# Patient Record
Sex: Female | Born: 1980
Health system: Southern US, Community
[De-identification: ages and names within clinical notes are randomized; demographics above are authoritative.]

## PROBLEM LIST (undated history)

## (undated) DIAGNOSIS — E669 Obesity, unspecified: Secondary | ICD-10-CM

## (undated) DIAGNOSIS — I10 Essential (primary) hypertension: Secondary | ICD-10-CM

## (undated) DIAGNOSIS — F419 Anxiety disorder, unspecified: Secondary | ICD-10-CM

## (undated) HISTORY — DX: Anxiety disorder, unspecified: F41.9

## (undated) HISTORY — DX: Obesity, unspecified: E66.9

---

## 2001-02-23 ENCOUNTER — Other Ambulatory Visit: Admission: RE | Admit: 2001-02-23 | Discharge: 2001-02-23 | Payer: Self-pay | Admitting: Family Medicine

## 2014-03-12 DIAGNOSIS — I10 Essential (primary) hypertension: Secondary | ICD-10-CM | POA: Insufficient documentation

## 2014-06-15 DIAGNOSIS — E669 Obesity, unspecified: Secondary | ICD-10-CM | POA: Insufficient documentation

## 2015-01-04 ENCOUNTER — Encounter: Payer: Self-pay | Admitting: *Deleted

## 2015-01-08 ENCOUNTER — Ambulatory Visit (INDEPENDENT_AMBULATORY_CARE_PROVIDER_SITE_OTHER): Payer: BLUE CROSS/BLUE SHIELD | Admitting: *Deleted

## 2015-01-08 VITALS — BP 123/88 | HR 78 | Ht 65.0 in | Wt 287.7 lb

## 2015-01-08 DIAGNOSIS — E669 Obesity, unspecified: Secondary | ICD-10-CM

## 2015-01-08 MED ORDER — CYANOCOBALAMIN 1000 MCG/ML IJ SOLN
1000.0000 ug | Freq: Once | INTRAMUSCULAR | Status: AC
  Administered 2015-01-08: 1000 ug via INTRAMUSCULAR

## 2015-01-08 NOTE — Progress Notes (Cosign Needed)
Pt is here for wt, bp check, b-12 inj Denies any s/e, she is doing well and proud of her weight loss

## 2015-02-05 ENCOUNTER — Ambulatory Visit (INDEPENDENT_AMBULATORY_CARE_PROVIDER_SITE_OTHER): Payer: BLUE CROSS/BLUE SHIELD | Admitting: Obstetrics and Gynecology

## 2015-02-05 VITALS — BP 128/84 | HR 88 | Ht 65.0 in | Wt 283.5 lb

## 2015-02-05 DIAGNOSIS — E669 Obesity, unspecified: Secondary | ICD-10-CM

## 2015-02-05 MED ORDER — CYANOCOBALAMIN 1000 MCG/ML IJ SOLN
1000.0000 ug | Freq: Once | INTRAMUSCULAR | Status: AC
  Administered 2015-02-05: 1000 ug via INTRAMUSCULAR

## 2015-02-05 NOTE — Progress Notes (Cosign Needed)
Pt is here for wt, bp check, b-12 inj Pt is doing well denies any s/e with medication

## 2015-03-06 ENCOUNTER — Ambulatory Visit (INDEPENDENT_AMBULATORY_CARE_PROVIDER_SITE_OTHER): Payer: BLUE CROSS/BLUE SHIELD | Admitting: Obstetrics and Gynecology

## 2015-03-06 VITALS — BP 132/85 | HR 75 | Ht 65.0 in | Wt 280.2 lb

## 2015-03-06 DIAGNOSIS — E669 Obesity, unspecified: Secondary | ICD-10-CM

## 2015-03-06 MED ORDER — CYANOCOBALAMIN 1000 MCG/ML IJ SOLN
1000.0000 ug | Freq: Once | INTRAMUSCULAR | Status: AC
  Administered 2015-03-06: 1000 ug via INTRAMUSCULAR

## 2015-03-06 NOTE — Progress Notes (Signed)
Patient ID: Cathy Jimenez, female   DOB: 1980-08-11, 34 y.o.   MRN: [id number] Pt presents for weight, B/P check and B-12 injection. Has weight loss of 3.4 lbs since last visit. No c/o side effects of medication. Doing well.

## 2015-03-20 ENCOUNTER — Ambulatory Visit: Payer: BLUE CROSS/BLUE SHIELD

## 2015-03-20 ENCOUNTER — Encounter: Payer: Self-pay | Admitting: Obstetrics and Gynecology

## 2015-03-20 MED ORDER — PHENTERMINE HCL 37.5 MG PO TABS: 37.5000 mg | ORAL_TABLET | Freq: Every day | ORAL | Status: DC

## 2015-03-29 ENCOUNTER — Ambulatory Visit: Payer: BLUE CROSS/BLUE SHIELD | Admitting: Obstetrics and Gynecology

## 2015-04-26 ENCOUNTER — Ambulatory Visit (INDEPENDENT_AMBULATORY_CARE_PROVIDER_SITE_OTHER): Payer: BLUE CROSS/BLUE SHIELD | Admitting: Obstetrics and Gynecology

## 2015-04-26 VITALS — BP 122/84 | HR 73 | Ht 64.0 in | Wt 278.2 lb

## 2015-04-26 DIAGNOSIS — E669 Obesity, unspecified: Secondary | ICD-10-CM

## 2015-04-26 MED ORDER — CYANOCOBALAMIN 1000 MCG/ML IJ SOLN
1000.0000 ug | Freq: Once | INTRAMUSCULAR | Status: AC
  Administered 2015-04-26: 1000 ug via INTRAMUSCULAR
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
## 2015-05-24 ENCOUNTER — Ambulatory Visit: Payer: BLUE CROSS/BLUE SHIELD

## 2015-07-19 ENCOUNTER — Ambulatory Visit: Payer: BLUE CROSS/BLUE SHIELD

## 2015-07-25 ENCOUNTER — Ambulatory Visit (INDEPENDENT_AMBULATORY_CARE_PROVIDER_SITE_OTHER): Payer: BLUE CROSS/BLUE SHIELD | Admitting: Obstetrics and Gynecology

## 2015-07-25 VITALS — BP 108/78 | HR 62 | Wt 273.4 lb

## 2015-07-25 DIAGNOSIS — E669 Obesity, unspecified: Secondary | ICD-10-CM

## 2015-07-25 MED ORDER — CYANOCOBALAMIN 1000 MCG/ML IJ SOLN
1000.0000 ug | Freq: Once | INTRAMUSCULAR | Status: AC
  Administered 2015-07-25: 1000 ug via INTRAMUSCULAR

## 2015-07-25 NOTE — Progress Notes (Cosign Needed)
Patient ID: Cathy Jimenez, female   DOB: 16-May-1981, 35 y.o.   MRN: [id number] Pt presents for weight, B/P, B-12 injection. No side effects of medication-Phentermine, or B-12.  Weight loss of 5 lbs. Encouraged eating healthy and exercise.

## 2015-08-22 ENCOUNTER — Ambulatory Visit: Payer: BLUE CROSS/BLUE SHIELD | Admitting: Obstetrics and Gynecology

## 2015-08-27 DIAGNOSIS — Z7189 Other specified counseling: Secondary | ICD-10-CM | POA: Insufficient documentation

## 2015-08-28 ENCOUNTER — Encounter: Payer: Self-pay | Admitting: Obstetrics and Gynecology

## 2015-08-28 ENCOUNTER — Ambulatory Visit (INDEPENDENT_AMBULATORY_CARE_PROVIDER_SITE_OTHER): Payer: BLUE CROSS/BLUE SHIELD | Admitting: Obstetrics and Gynecology

## 2015-08-28 MED ORDER — CYANOCOBALAMIN 1000 MCG/ML IJ SOLN: 1000.0000 ug | Freq: Once | INTRAMUSCULAR | Status: DC

## 2015-08-28 MED ORDER — PHENTERMINE HCL 37.5 MG PO TABS: 37.5000 mg | ORAL_TABLET | Freq: Every day | ORAL | Status: DC

## 2015-08-28 NOTE — Progress Notes (Signed)
SUBJECTIVE:  34 y.o. here for follow-up weight loss visit, previously seen 4 weeks ago. Denies any concerns and feels like medication worked well when she takes it regularly and exercises as she should. Did regain 5 lbs since last visit.   OBJECTIVE:  BP 127/83 mmHg  Pulse 62  Ht  (1.626 m)  Wt 280 lb 6.4 oz (127.189 kg)  BMI 48.11 kg/m2  Body mass index is 48.11 kg/(m^2). Patient appears well. ASSESSMENT:  Obesity- responding well to weight loss plan PLAN:  To continue with current medications and new RX.. B12 1055mcg/ml injection given RTC in 4 weeks as planned. Explained if patient doesn't lose at least 28# over next 3 months, will not be able to continue medications.  Lyndall Bellot Logan, CNM

## 2015-09-16 ENCOUNTER — Emergency Department: Payer: BLUE CROSS/BLUE SHIELD

## 2015-09-16 ENCOUNTER — Encounter: Payer: Self-pay | Admitting: Emergency Medicine

## 2015-09-16 ENCOUNTER — Observation Stay
Admission: EM | Admit: 2015-09-16 | Discharge: 2015-09-17 | Disposition: A | Discharge status: Home / Self Care | Payer: BLUE CROSS/BLUE SHIELD | Attending: Internal Medicine | Admitting: Internal Medicine

## 2015-09-16 DIAGNOSIS — Z6841 Body Mass Index (BMI) 40.0 and over, adult: Secondary | ICD-10-CM | POA: Diagnosis not present

## 2015-09-16 DIAGNOSIS — I10 Essential (primary) hypertension: Secondary | ICD-10-CM | POA: Insufficient documentation

## 2015-09-16 DIAGNOSIS — R0602 Shortness of breath: Secondary | ICD-10-CM | POA: Diagnosis not present

## 2015-09-16 DIAGNOSIS — Z79899 Other long term (current) drug therapy: Secondary | ICD-10-CM | POA: Insufficient documentation

## 2015-09-16 DIAGNOSIS — R918 Other nonspecific abnormal finding of lung field: Secondary | ICD-10-CM | POA: Diagnosis not present

## 2015-09-16 DIAGNOSIS — Z7951 Long term (current) use of inhaled steroids: Secondary | ICD-10-CM | POA: Diagnosis not present

## 2015-09-16 DIAGNOSIS — R0789 Other chest pain: Secondary | ICD-10-CM

## 2015-09-16 DIAGNOSIS — E876 Hypokalemia: Secondary | ICD-10-CM | POA: Diagnosis present

## 2015-09-16 DIAGNOSIS — R748 Abnormal levels of other serum enzymes: Secondary | ICD-10-CM | POA: Insufficient documentation

## 2015-09-16 DIAGNOSIS — E669 Obesity, unspecified: Secondary | ICD-10-CM | POA: Insufficient documentation

## 2015-09-16 DIAGNOSIS — R079 Chest pain, unspecified: Principal | ICD-10-CM | POA: Diagnosis present

## 2015-09-16 DIAGNOSIS — Z87891 Personal history of nicotine dependence: Secondary | ICD-10-CM | POA: Insufficient documentation

## 2015-09-16 DIAGNOSIS — Z975 Presence of (intrauterine) contraceptive device: Secondary | ICD-10-CM | POA: Insufficient documentation

## 2015-09-16 DIAGNOSIS — F410 Panic disorder [episodic paroxysmal anxiety] without agoraphobia: Secondary | ICD-10-CM | POA: Diagnosis present

## 2015-09-16 DIAGNOSIS — F419 Anxiety disorder, unspecified: Secondary | ICD-10-CM | POA: Diagnosis present

## 2015-09-16 DIAGNOSIS — Z791 Long term (current) use of non-steroidal anti-inflammatories (NSAID): Secondary | ICD-10-CM | POA: Insufficient documentation

## 2015-09-16 HISTORY — DX: Essential (primary) hypertension: I10

## 2015-09-16 LAB — BASIC METABOLIC PANEL
Anion gap: 9 (ref 5–15)
BUN: 14 mg/dL (ref 6–20)
CO2: 23 mmol/L (ref 22–32)
Calcium: 9.5 mg/dL (ref 8.9–10.3)
Chloride: 105 mmol/L (ref 101–111)
Creatinine, Ser: 0.81 mg/dL (ref 0.44–1.00)
GFR calc Af Amer: >60 mL/min (ref >60)
GFR calc non Af Amer: >60 mL/min (ref >60)
Glucose, Bld: 82 mg/dL (ref 65–99)
Potassium: 3.2 mmol/L — ABNORMAL LOW (ref 3.5–5.1)
Sodium: 137 mmol/L (ref 135–145)

## 2015-09-16 LAB — CBC
HCT: 40.4 % (ref 35.0–47.0)
Hemoglobin: 13.7 g/dL (ref 12.0–16.0)
MCH: 29.5 pg (ref 26.0–34.0)
MCHC: 34 g/dL (ref 32.0–36.0)
MCV: 86.6 fL (ref 80.0–100.0)
Platelets: 313 10*3/uL (ref 150–440)
RBC: 4.66 MIL/uL (ref 3.80–5.20)
RDW: 13.9 % (ref 11.5–14.5)
WBC: 13.6 10*3/uL — ABNORMAL HIGH (ref 3.6–11.0)

## 2015-09-16 LAB — TROPONIN I
Troponin I: 0.04 ng/mL — ABNORMAL HIGH (ref <0.031)
Troponin I: 0.05 ng/mL — ABNORMAL HIGH (ref <0.031)

## 2015-09-16 LAB — POCT PREGNANCY, URINE: Preg Test, Ur: NEGATIVE

## 2015-09-16 LAB — FIBRIN DERIVATIVES D-DIMER (ARMC ONLY): Fibrin derivatives D-dimer (ARMC): 402 (ref 0–499)

## 2015-09-16 MED ORDER — IOHEXOL 350 MG/ML SOLN
125.0000 mL | Freq: Once | INTRAVENOUS | Status: AC | PRN
  Administered 2015-09-16: 125 mL via INTRAVENOUS

## 2015-09-16 NOTE — ED Notes (Signed)
I hooked patient up to machines when she was brought to the room.  Patient pleasant, with nervous laugh.

## 2015-09-16 NOTE — ED Notes (Signed)
Family at bedside. 

## 2015-09-16 NOTE — Discharge Instructions (Signed)
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
## 2015-09-16 NOTE — ED Notes (Signed)
Carried patient a beverage.  Her mother had gone home.  Patient sitting up talking on phone taking care of work needs for tomorrow.

## 2015-09-16 NOTE — ED Notes (Signed)
Family at bedside.  Heart monitor was dinging, went in to check on patient.  Monitor showing apnea but patient was fine.

## 2015-09-16 NOTE — ED Notes (Signed)
States at around 1100 began feeling chest pressure, felt like a "Panic Attack".  Went on break, tried relaxing, seemed better, then when getting up again and moving around felt heart racing and SOB.  Went to [facility] for evaluation and sent to ED for "more scans".

## 2015-09-16 NOTE — ED Provider Notes (Addendum)
-----------------------------------------   11:43 PM on 09/16/2015 -----------------------------------------   Physical Exam  BP 132/85 mmHg  Pulse 76  Temp(Src) 98.3 F (36.8 C) (Oral)  Resp 12  Ht 5\' 5"  (1.651 m)  Wt 280 lb (127.007 kg)  BMI 46.59 kg/m2  SpO2 100%  Physical Exam No distress at this time and patient says that the symptoms are relieved. ED Course  Procedures  MDM Patient with elevated troponin 2. Signout from Dr. Alphonzo LemmingsMcShane was to reassess the patient in follow-up with second troponin. However, the second troponin was slightly higher than the first. I discussed with the patient her symptoms she said that she had chest pressure or shortness of breath with the pressure radiating through to her back. She says the symptoms today were different from previous because they lasted about 6 hours. Because of the radiation as well as the associated shortness of breath symptoms I'm concerned especially in the context of 2 elevated troponins. The patient will be admitted to the hospital for observation. She understands this plan and is willing to comply. Signed out to Dr. Loney Lohseni of the medicine service.      Myrna Blazeravid Matthew Ayerim Berquist, MD 09/16/15 2345  Dr. Loney Lohseni requested a CAT scan of the chest with dissection protocol.  Myrna Blazeravid Matthew Kemoni Quesenberry, MD 09/16/15 [phone]

## 2015-09-17 ENCOUNTER — Encounter: Payer: Self-pay | Admitting: Nurse Practitioner

## 2015-09-17 ENCOUNTER — Observation Stay: Payer: BLUE CROSS/BLUE SHIELD

## 2015-09-17 DIAGNOSIS — R079 Chest pain, unspecified: Secondary | ICD-10-CM | POA: Diagnosis present

## 2015-09-17 DIAGNOSIS — E876 Hypokalemia: Secondary | ICD-10-CM | POA: Diagnosis present

## 2015-09-17 DIAGNOSIS — F419 Anxiety disorder, unspecified: Secondary | ICD-10-CM | POA: Diagnosis present

## 2015-09-17 LAB — BASIC METABOLIC PANEL
Anion gap: 6 (ref 5–15)
BUN: 11 mg/dL (ref 6–20)
CO2: 26 mmol/L (ref 22–32)
Calcium: 8.9 mg/dL (ref 8.9–10.3)
Chloride: 106 mmol/L (ref 101–111)
Creatinine, Ser: 0.75 mg/dL (ref 0.44–1.00)
GFR calc Af Amer: >60 mL/min (ref >60)
GFR calc non Af Amer: >60 mL/min (ref >60)
Glucose, Bld: 116 mg/dL — ABNORMAL HIGH (ref 65–99)
Potassium: 3.7 mmol/L (ref 3.5–5.1)
Sodium: 138 mmol/L (ref 135–145)

## 2015-09-17 LAB — NM MYOCAR MULTI W/SPECT W/WALL MOTION / EF
Estimated workload: 1 METS
Exercise duration (min): 1 min
Exercise duration (sec): 2 s
LV dias vol: 113 mL
LV sys vol: 46 mL
Peak HR: 115 {beats}/min
Rest HR: 77 {beats}/min
SDS: 0
SRS: 1
SSS: 0
TID: 1

## 2015-09-17 LAB — CBC
HCT: 38.7 % (ref 35.0–47.0)
Hemoglobin: 13.3 g/dL (ref 12.0–16.0)
MCH: 29.7 pg (ref 26.0–34.0)
MCHC: 34.4 g/dL (ref 32.0–36.0)
MCV: 86.3 fL (ref 80.0–100.0)
Platelets: 278 10*3/uL (ref 150–440)
RBC: 4.48 MIL/uL (ref 3.80–5.20)
RDW: 13.7 % (ref 11.5–14.5)
WBC: 9.1 10*3/uL (ref 3.6–11.0)

## 2015-09-17 LAB — LIPID PANEL
Cholesterol: 153 mg/dL (ref 0–200)
HDL: 43 mg/dL (ref >40)
LDL Cholesterol: 93 mg/dL (ref 0–99)
Total CHOL/HDL Ratio: 3.6 RATIO
Triglycerides: 86 mg/dL (ref <150)
VLDL: 17 mg/dL (ref 0–40)

## 2015-09-17 LAB — TROPONIN I
Troponin I: <0.03 ng/mL (ref <0.031)
Troponin I: 0.03 ng/mL (ref <0.031)

## 2015-09-17 LAB — HEMOGLOBIN A1C: Hgb A1c MFr Bld: 4.9 % (ref 4.0–6.0)

## 2015-09-17 LAB — MAGNESIUM: Magnesium: 2.1 mg/dL (ref 1.7–2.4)

## 2015-09-17 MED ORDER — CITALOPRAM HYDROBROMIDE 20 MG PO TABS
20.0000 mg | ORAL_TABLET | Freq: Every day | ORAL | Status: DC
  Administered 2015-09-17: 20 mg via ORAL
  Filled 2015-09-17: qty 1

## 2015-09-17 MED ORDER — TECHNETIUM TC 99M SESTAMIBI - CARDIOLITE
32.5900 | Freq: Once | INTRAVENOUS | Status: AC | PRN
  Administered 2015-09-17: 32.59 via INTRAVENOUS

## 2015-09-17 MED ORDER — POTASSIUM CHLORIDE 20 MEQ/15ML (10%) PO SOLN
40.0000 meq | Freq: Once | ORAL | Status: AC
  Administered 2015-09-17: 40 meq via ORAL
  Filled 2015-09-17: qty 30

## 2015-09-17 MED ORDER — LORAZEPAM 0.5 MG PO TABS: 0.5000 mg | ORAL_TABLET | Freq: Four times a day (QID) | ORAL | Status: DC | PRN

## 2015-09-17 MED ORDER — NITROGLYCERIN 2 % TD OINT
0.5000 [in_us] | TOPICAL_OINTMENT | Freq: Four times a day (QID) | TRANSDERMAL | Status: DC
  Administered 2015-09-17 (×2): 0.5 [in_us] via TOPICAL
  Filled 2015-09-17 (×2): qty 1

## 2015-09-17 MED ORDER — HYDROCHLOROTHIAZIDE 25 MG PO TABS
25.0000 mg | ORAL_TABLET | Freq: Every day | ORAL | Status: DC
  Administered 2015-09-17: 25 mg via ORAL
  Filled 2015-09-17: qty 1

## 2015-09-17 MED ORDER — ENOXAPARIN SODIUM 40 MG/0.4ML ~~LOC~~ SOLN
40.0000 mg | Freq: Two times a day (BID) | SUBCUTANEOUS | Status: DC
  Administered 2015-09-17: 40 mg via SUBCUTANEOUS
  Filled 2015-09-17: qty 0.4

## 2015-09-17 MED ORDER — TECHNETIUM TC 99M SESTAMIBI - CARDIOLITE
13.9000 | Freq: Once | INTRAVENOUS | Status: AC | PRN
  Administered 2015-09-17: 13.9 via INTRAVENOUS

## 2015-09-17 MED ORDER — REGADENOSON 0.4 MG/5ML IV SOLN
0.4000 mg | Freq: Once | INTRAVENOUS | Status: AC
  Administered 2015-09-17: 0.4 mg via INTRAVENOUS

## 2015-09-17 MED ORDER — ASPIRIN EC 325 MG PO TBEC
325.0000 mg | DELAYED_RELEASE_TABLET | Freq: Every day | ORAL | Status: DC
  Administered 2015-09-17: 325 mg via ORAL
  Filled 2015-09-17 (×2): qty 1

## 2015-09-17 MED ORDER — ACETAMINOPHEN 325 MG PO TABS
650.0000 mg | ORAL_TABLET | Freq: Four times a day (QID) | ORAL | Status: DC | PRN
  Administered 2015-09-17: 650 mg via ORAL
  Filled 2015-09-17: qty 2

## 2015-09-17 MED ORDER — FLUTICASONE PROPIONATE 50 MCG/ACT NA SUSP
1.0000 | Freq: Every day | NASAL | Status: DC
  Filled 2015-09-17: qty 16

## 2015-09-17 NOTE — Progress Notes (Signed)
Patient discharged via wheelchair and private vehicle. IV removed and catheter intact. All discharge instructions given and patient verbalizes understanding. Tele removed and returned. No prescriptions given to patient No distress noted.   

## 2015-09-17 NOTE — ED Notes (Signed)
Patient doing phone text.  I turned her monitor back on and turned out the bright light.  I informed patient we were still waiting on a bed.

## 2015-09-17 NOTE — ED Notes (Signed)
Checked on patient, sitting quietly up in bed.  Gave her update on room.

## 2015-09-17 NOTE — H&P (Signed)
[facility] [place] at [facility]   PATIENT NAME: Cathy PlanKimberly Henkin    MR#:  [id number]  DATE OF BIRTH:  1980/08/26  DATE OF ADMISSION:  09/16/2015  PRIMARY CARE PHYSICIAN: Marisue IvanLINTHAVONG, KANHKA, MD   REQUESTING/REFERRING PHYSICIAN: Dr. Pershing ProudSchaevitz  CHIEF COMPLAINT:   Chief Complaint  Patient presents with  . Shortness of Breath  . Chest Pain    HISTORY OF PRESENT ILLNESS: Cathy Jimenez  is a 35 y.o. female with a known history of hypertension, obesity and anxiety. She presents to the ED today due to complaints of chest pain. Patient states that she was at work and doing some paperwork when she developed a retrosternal chest pain. She describes the pain as chest pressure and rotated at 8/10 at its worse. When she tried to walk around, chest pain became worse. Has now radiated to the left and right shoulders and also to the back. She had some palpitations during episodes of chest pain. Sitting/resting decreased chest pain. She denies any nausea no vomiting and also denies any diaphoresis, dizziness, syncope or confusion. There was no reported abdominal pain, change in appetite or any changes in bowel or urine habits. She is not bleeding from any orifice. She states that she did not initially have any shortness of breath but on arrival in the ED, while she was walking to the building, she developed shortness of breath. She denies any cough, fever or wheezing and denies any orthopnea, PND or leg swelling. She denies smoking or recreational drug use but endorses occasional alcohol use. She initially felt to cause her anxiety attack but decided to present to the ED due to the pressure like in nature of the chest pain.  On arrival in the ED, vitals are stable and CBC was remarkable for white count of 13.6. CMP was only significant for a potassium of 3.2. Chest x-ray was negative and CT PE was also negative. EKG showed normal sinus rhythm with some T-wave flattening and initial  troponin was 0.04. A repeat troponin was 0.05. Patient was given some aspirin and will be admitted for observation for chest pain/ACS rule out and h hypokalemia. She wishes to be full code.  PAST MEDICAL HISTORY:   Past Medical History  Diagnosis Date  . Obesity   . Anxiety   . Hypertension   . Anxiety     PAST SURGICAL HISTORY: Past Surgical History  Procedure Laterality Date  . Cesarean section  2010    PIH    SOCIAL HISTORY:  Social History  Substance Use Topics  . Smoking status: Former Games developermoker  . Smokeless tobacco: Never Used  . Alcohol Use: Yes     Comment: OCCAS    FAMILY HISTORY:  Family History  Problem Relation Age of Onset  . Heart disease Father   . Hypertension Father   . Diabetes Maternal Grandmother   . Heart disease Maternal Grandmother   . Cancer Maternal Grandfather     LIVER    DRUG ALLERGIES: No Known Allergies  REVIEW OF SYSTEMS:   CONSTITUTIONAL: No fever, fatigue or weakness.  EYES: No blurred or double vision.  EARS, NOSE, AND THROAT: No tinnitus or ear pain.  RESPIRATORY: As in history of present illness CARDIOVASCULAR: As in history of present illness GASTROINTESTINAL: No nausea, vomiting, diarrhea or abdominal pain.  GENITOURINARY: No dysuria, hematuria.  ENDOCRINE: No polyuria, nocturia,  HEMATOLOGY: No anemia, easy bruising or bleeding SKIN: No rash or lesion. MUSCULOSKELETAL: No joint pain or arthritis.  NEUROLOGIC: No tingling, numbness, weakness.  PSYCHIATRY: No anxiety or depression.   MEDICATIONS AT HOME:  Prior to Admission medications   Medication Sig Start Date End Date Taking? Authorizing Provider  citalopram (CELEXA) 20 MG tablet Take 20 mg by mouth daily.   Yes Historical Provider, MD  cyanocobalamin (,VITAMIN B-12,) 1000 MCG/ML injection Inject 1 mL (1,000 mcg total) into the muscle once. 08/28/15  Yes Melody N Shambley, CNM  fluticasone (FLONASE) 50 MCG/ACT nasal spray Place into the nose. 04/30/14  Yes Historical  Provider, MD  hydrochlorothiazide (HYDRODIURIL) 25 MG tablet Take 25 mg by mouth daily.   Yes Historical Provider, MD  levonorgestrel (MIRENA) 20 MCG/24HR IUD 1 each by Intrauterine route once.   Yes Historical Provider, MD  phentermine (ADIPEX-P) 37.5 MG tablet Take 1 tablet (37.5 mg total) by mouth daily before breakfast. 08/28/15  Yes Melody N Shambley, CNM  metaxalone (SKELAXIN) 800 MG tablet Take 800 mg by mouth 3 (three) times daily. Reported on 09/16/2015    Historical Provider, MD  naproxen (NAPROSYN) 500 MG tablet Take 500 mg by mouth 2 (two) times daily with a meal. Reported on 09/16/2015    Historical Provider, MD      PHYSICAL EXAMINATION:   VITAL SIGNS: Blood pressure 132/85, pulse 76, temperature 98.3 F (36.8 C), temperature source Oral, resp. rate 12, height  (1.651 m), weight 127.007 kg (280 lb), SpO2 100 %.  GENERAL:  35 y.o.-year-old patient lying in the bed with no acute distress. Alert and oriented x 3. EYES: Pupils equal, round, reactive to light and accommodation. No scleral icterus. Extraocular muscles intact.  HEENT: Head atraumatic, normocephalic. Oropharynx and nasopharynx clear.  NECK:  Supple, no jugular venous distention. No thyroid enlargement, no tenderness.  LUNGS: Normal breath sounds bilaterally, no wheezing, rales,rhonchi or crepitation. No use of accessory muscles of respiration.  CARDIOVASCULAR: S1, S2 normal. No murmurs, rubs, or gallops.  ABDOMEN: Soft, nontender, nondistended. Bowel sounds present. No organomegaly or mass.  EXTREMITIES: No pedal edema, cyanosis, or clubbing.  NEUROLOGIC: Cranial nerves II through XII are intact. Muscle strength 5/5 in all extremities. Sensation intact. Gait not checked.   SKIN: No obvious rash, lesion, or ulcer.   LABORATORY PANEL:   CBC  Recent Labs Lab 09/16/15 1806  WBC 13.6*  HGB 13.7  HCT 40.4  PLT 313  MCV 86.6  MCH 29.5  MCHC 34.0  RDW 13.9    ------------------------------------------------------------------------------------------------------------------  Chemistries   Recent Labs Lab 09/16/15 1806  NA 137  K 3.2*  CL 105  CO2 23  GLUCOSE 82  BUN 14  CREATININE 0.81  CALCIUM 9.5   ------------------------------------------------------------------------------------------------------------------ estimated creatinine clearance is 131.3 mL/min (by C-G formula based on Cr of 0.81). ------------------------------------------------------------------------------------------------------------------ No results for input(s): TSH, T4TOTAL, T3FREE, THYROIDAB in the last 72 hours.  Invalid input(s): FREET3   Coagulation profile No results for input(s): INR, PROTIME in the last 168 hours. ------------------------------------------------------------------------------------------------------------------- No results for input(s): DDIMER in the last 72 hours. -------------------------------------------------------------------------------------------------------------------  Cardiac Enzymes  Recent Labs Lab 09/16/15 1806 09/16/15 2151  TROPONINI 0.04* 0.05*   ------------------------------------------------------------------------------------------------------------------ Invalid input(s): POCBNP  ---------------------------------------------------------------------------------------------------------------  Urinalysis No results found for: COLORURINE, APPEARANCEUR, LABSPEC, PHURINE, GLUCOSEU, HGBUR, BILIRUBINUR, KETONESUR, PROTEINUR, UROBILINOGEN, NITRITE, LEUKOCYTESUR   RADIOLOGY: Dg Chest 2 View  09/16/2015  CLINICAL DATA:  Shortness of breath with chest pressure today. Nonsmoker. EXAM: CHEST  2 VIEW COMPARISON:  None. FINDINGS: Suboptimal inspiration on the frontal examination. The heart size and mediastinal contours are normal. Patchy opacities at  the lung bases on the frontal examination are attributed to  atelectasis and overlying breast tissue. The lungs appear clear based on the lateral view. There is no pleural effusion or pneumothorax. No osseous abnormalities are apparent. IMPRESSION: No definite active cardiopulmonary process allowing for suboptimal inspiration on the frontal examination. The lungs appear clear on the lateral view. Electronically Signed   By: Carey Bullocks M.D.   On: 09/16/2015 18:35   Ct Angio Chest Aorta W/cm &/or Wo/cm  09/17/2015  CLINICAL DATA:  Chest pain radiating to the back. EXAM: CT ANGIOGRAPHY CHEST WITH CONTRAST TECHNIQUE: Multidetector CT imaging of the chest was performed using the standard protocol during bolus administration of intravenous contrast. Multiplanar CT image reconstructions and MIPs were obtained to evaluate the vascular anatomy. CONTRAST:  OMNIPAQUE IOHEXOL 350 MG/ML SOLN COMPARISON:  Radiographs yesterday. FINDINGS: Allowing for cardiac motion, no aortic dissection. Thoracic aorta is normal in caliber. No significant atherosclerosis. No aneurysm or aortic hematoma. Left vertebral artery arises directly from the aorta, a normal variant. No filling defects in the central pulmonary arteries to the segmental level to suggest pulmonary embolus. Heart is normal in size. No pleural or pericardial effusion. No mediastinal or hilar adenopathy. No consolidation, pulmonary edema, mass or suspicious nodule. Heterogeneous attenuation on postcontrast imaging likely related to phase of respiration. Distal esophagus is patulous. No acute abnormality in the included upper abdomen. There are no acute or suspicious osseous abnormalities. Review of the MIP images confirms the above findings. IMPRESSION: No aortic dissection or acute aortic abnormality. No acute intrathoracic process. Electronically Signed   By: Rubye Oaks M.D.   On: 09/17/2015 00:48    EKG: Orders placed or performed during the hospital encounter of 09/16/15  . EKG 12-Lead  . EKG 12-Lead  . ED  EKG within 10 minutes  . ED EKG within 10 minutes    ASSESSMENT  Principal Problem:   Chest pain Active Problems:   Hypokalemia   Anxiety  PLAN   1). Chest pain with marginally elevated troponin - Patient presented with chest pain that is vaguely related to exertion. Chest pain has subsided at this time without intervention. - CTA is negative for dissection and PE. - Patient on Nitroglycerin and Aspirin - Initial troponin was 0.04 but subsequent troponin was 0.05. We'll cycle 2 more sets to rule out ACS. - Make patient nothing by mouth for stress test in the morning.  2). Hypokalemia - Potassium was 3.2 on arrival. Replete with 40 mEq of potassium. - Check magnesium levels. - Monitor potassium levels.  3). Anxiety - When necessary Ativan by mouth.  All the records are reviewed and case discussed with ED provider. Management plans discussed with the patient, family and they are in agreement.  CODE STATUS: Code Status History    This patient does not have a recorded code status. Please follow your organizational policy for patients in this situation.      I have independently reviewed all EKG and chest x-ray data  VTE prophylaxis: Lovenox if no contraindications and patient at low risk for bleeding. SCD's and progressive ambulation if patient has contraindications to anticoagulants. No DVT prophylaxis if patient presently receiving therapeutic anticoagulation or is at significant risk of bleeding for which the risk of anticoagulation outweigh the potential benefits.  Vaccinations: Pneumonia & flu vaccine per hospital protocol Prevention: Will proceed with conservative measures for the prevention of delirium in patients older than 65. Fall precautions and 1:1 sitter as needed per hospital protocol.  TOTAL TIME TAKING CARE OF THIS PATIENT: 40 minutes.    Robley Fries M.D on 09/17/2015 at 1:09 AM  Between 7am to 6pm - Pager - [phone]  After 6pm go to [email or web address]  - password [email or web address] [facility]  [place] [facility]  Office  [phone]  CC: Primary care physician; Marisue Ivan, MD

## 2015-09-17 NOTE — Plan of Care (Signed)
Problem: Phase I Progression Outcomes Goal: Hemodynamically stable Outcome: Progressing No voiced complaints of pain since arrival to unit bed.  SR continues on telemetry.

## 2015-09-17 NOTE — ED Notes (Signed)
Attempted report x1. 

## 2015-09-18 NOTE — Discharge Summary (Signed)
[facility] - [place] at [facility]   PATIENT NAME: Cathy Jimenez    MR#:  [id number]  DATE OF BIRTH:  24-Mar-1981  DATE OF ADMISSION:  09/16/2015 ADMITTING PHYSICIAN: Ihor Austin, MD  DATE OF DISCHARGE: 09/17/2015  3:54 PM  PRIMARY CARE PHYSICIAN: Marisue Ivan, MD    ADMISSION DIAGNOSIS:  Anxiety attack [F41.0] Atypical chest pain [R07.89]  DISCHARGE DIAGNOSIS:  Principal Problem:   Chest pain Active Problems:   Hypokalemia   Anxiety   SECONDARY DIAGNOSIS:   Past Medical History  Diagnosis Date  . Obesity   . Anxiety   . Hypertension     HOSPITAL COURSE:  35 y.o. female with a known history of hypertension, obesity and anxiety admitted due to complaints of chest pain. Patient states that she was at work and doing some paperwork when she developed a retrosternal chest pain. Please see Dr Dorene Ar dictated [facility] & P for further details.  1). Chest pain with marginally elevated troponin - Ruled out with serial troponins - Neg myoview - Unlikely cardiac. Seems more likely due to anxiety  2). Hypokalemia - repleted and resolved  3). Anxiety - When necessary Ativan by mouth.  She was agreeable with D/C plans. D/C home in stable condition.  DISCHARGE CONDITIONS:   stable  CONSULTS OBTAINED:     DRUG ALLERGIES:  No Known Allergies  DISCHARGE MEDICATIONS:   Discharge Medication List as of 09/17/2015  3:18 PM    CONTINUE these medications which have NOT CHANGED   Details  citalopram (CELEXA) 20 MG tablet Take 20 mg by mouth daily., Until Discontinued, Historical Med    cyanocobalamin (,VITAMIN B-12,) 1000 MCG/ML injection Inject 1 mL (1,000 mcg total) into the muscle once., Starting 08/28/2015, Normal    fluticasone (FLONASE) 50 MCG/ACT nasal spray Place into the nose., Starting 04/30/2014, Until Discontinued, Historical Med    hydrochlorothiazide (HYDRODIURIL) 25 MG tablet Take 25 mg by mouth daily., Until Discontinued, Historical  Med    levonorgestrel (MIRENA) 20 MCG/24HR IUD 1 each by Intrauterine route once., Historical Med    phentermine (ADIPEX-P) 37.5 MG tablet Take 1 tablet (37.5 mg total) by mouth daily before breakfast., Starting 08/28/2015, Until Discontinued, Print    metaxalone (SKELAXIN) 800 MG tablet Take 800 mg by mouth 3 (three) times daily. Reported on 09/16/2015, Until Discontinued, Historical Med    naproxen (NAPROSYN) 500 MG tablet Take 500 mg by mouth 2 (two) times daily with a meal. Reported on 09/16/2015, Until Discontinued, Historical Med         DISCHARGE INSTRUCTIONS:    DIET:  Cardiac diet  DISCHARGE CONDITION:  Good  ACTIVITY:  Activity as tolerated  OXYGEN:  Home Oxygen: No.   Oxygen Delivery: room air  DISCHARGE LOCATION:  home   If you experience worsening of your admission symptoms, develop shortness of breath, life threatening emergency, suicidal or homicidal thoughts you must seek medical attention immediately by calling 911 or calling your MD immediately  if symptoms less severe.  You Must read complete instructions/literature along with all the possible adverse reactions/side effects for all the Medicines you take and that have been prescribed to you. Take any new Medicines after you have completely understood and accpet all the possible adverse reactions/side effects.   Please note  You were cared for by a hospitalist during your hospital stay. If you have any questions about your discharge medications or the care you received while you were in the hospital after you are discharged, you can  call the unit and asked to speak with the hospitalist on call if the hospitalist that took care of you is not available. Once you are discharged, your primary care physician will handle any further medical issues. Please note that NO REFILLS for any discharge medications will be authorized once you are discharged, as it is imperative that you return to your primary care physician (or  establish a relationship with a primary care physician if you do not have one) for your aftercare needs so that they can reassess your need for medications and monitor your lab values.    On the day of Discharge:  VITAL SIGNS:  Blood pressure 139/80, pulse 70, temperature 98.2 F (36.8 C), temperature source Oral, resp. rate 20, height 5\' 5"  (1.651 m), weight 119.341 kg (263 lb 1.6 oz), SpO2 100 %.  PHYSICAL EXAMINATION:  GENERAL:  35 y.o.-year-old patient lying in the bed with no acute distress.  EYES: Pupils equal, round, reactive to light and accommodation. No scleral icterus. Extraocular muscles intact.  HEENT: Head atraumatic, normocephalic. Oropharynx and nasopharynx clear.  NECK:  Supple, no jugular venous distention. No thyroid enlargement, no tenderness.  LUNGS: Normal breath sounds bilaterally, no wheezing, rales,rhonchi or crepitation. No use of accessory muscles of respiration.  CARDIOVASCULAR: S1, S2 normal. No murmurs, rubs, or gallops.  ABDOMEN: Soft, non-tender, non-distended. Bowel sounds present. No organomegaly or mass.  EXTREMITIES: No pedal edema, cyanosis, or clubbing.  NEUROLOGIC: Cranial nerves II through XII are intact. Muscle strength 5/5 in all extremities. Sensation intact. Gait not checked.  PSYCHIATRIC: The patient is alert and oriented x 3.  SKIN: No obvious rash, lesion, or ulcer.  DATA REVIEW:   CBC  Recent Labs Lab 09/17/15 0454  WBC 9.1  HGB 13.3  HCT 38.7  PLT 278    Chemistries   Recent Labs Lab 09/17/15 0135 09/17/15 0454  NA  --  138  K  --  3.7  CL  --  106  CO2  --  26  GLUCOSE  --  116*  BUN  --  11  CREATININE  --  0.75  CALCIUM  --  8.9  MG 2.1  --     Cardiac Enzymes  Recent Labs Lab 09/17/15 0454  TROPONINI 0.03    Microbiology Results  No results found for this or any previous visit.  RADIOLOGY:  Dg Chest 2 View  09/16/2015  CLINICAL DATA:  Shortness of breath with chest pressure today. Nonsmoker. EXAM:  CHEST  2 VIEW COMPARISON:  None. FINDINGS: Suboptimal inspiration on the frontal examination. The heart size and mediastinal contours are normal. Patchy opacities at the lung bases on the frontal examination are attributed to atelectasis and overlying breast tissue. The lungs appear clear based on the lateral view. There is no pleural effusion or pneumothorax. No osseous abnormalities are apparent. IMPRESSION: No definite active cardiopulmonary process allowing for suboptimal inspiration on the frontal examination. The lungs appear clear on the lateral view. Electronically Signed   By: Carey BullocksWilliam  Veazey M.D.   On: 09/16/2015 18:35   Nm Myocar Multi W/spect W/wall Motion / Ef  09/17/2015   The study is normal.  This is a low risk study.  The left ventricular ejection fraction is normal (55-65%).  There was no ST segment deviation noted during stress.    Ct Angio Chest Aorta W/cm &/or Wo/cm  09/17/2015  CLINICAL DATA:  Chest pain radiating to the back. EXAM: CT ANGIOGRAPHY CHEST WITH CONTRAST TECHNIQUE: Multidetector CT imaging of  the chest was performed using the standard protocol during bolus administration of intravenous contrast. Multiplanar CT image reconstructions and MIPs were obtained to evaluate the vascular anatomy. CONTRAST:  OMNIPAQUE IOHEXOL 350 MG/ML SOLN COMPARISON:  Radiographs yesterday. FINDINGS: Allowing for cardiac motion, no aortic dissection. Thoracic aorta is normal in caliber. No significant atherosclerosis. No aneurysm or aortic hematoma. Left vertebral artery arises directly from the aorta, a normal variant. No filling defects in the central pulmonary arteries to the segmental level to suggest pulmonary embolus. Heart is normal in size. No pleural or pericardial effusion. No mediastinal or hilar adenopathy. No consolidation, pulmonary edema, mass or suspicious nodule. Heterogeneous attenuation on postcontrast imaging likely related to phase of respiration. Distal esophagus is  patulous. No acute abnormality in the included upper abdomen. There are no acute or suspicious osseous abnormalities. Review of the MIP images confirms the above findings. IMPRESSION: No aortic dissection or acute aortic abnormality. No acute intrathoracic process. Electronically Signed   By: Rubye Oaks M.D.   On: 09/17/2015 00:48     Management plans discussed with the patient, family and they are in agreement.  CODE STATUS:  Code Status History    Date Active Date Inactive Code Status Order ID Comments User Context   09/17/2015  2:27 AM 09/17/2015  6:54 PM Full Code [id number]  Robley Fries, MD Inpatient      TOTAL TIME TAKING CARE OF THIS PATIENT: 40 minutes.    [facility], Araly Kaas M.D on 09/18/2015 at 3:48 PM  Between 7am to 6pm - Pager - [phone]  After 6pm go to [email or web address] - password [email or web address] [facility]  [facility]  Office  [phone]  CC: Primary care physician; Marisue Ivan, MD   Note: This dictation was prepared with Dragon dictation along with smaller phrase technology. Any transcriptional errors that result from this process are unintentional.

## 2015-09-25 ENCOUNTER — Ambulatory Visit (INDEPENDENT_AMBULATORY_CARE_PROVIDER_SITE_OTHER): Payer: BLUE CROSS/BLUE SHIELD | Admitting: Obstetrics and Gynecology

## 2015-09-25 VITALS — BP 121/95 | HR 62 | Wt 280.2 lb

## 2015-09-25 DIAGNOSIS — E669 Obesity, unspecified: Secondary | ICD-10-CM | POA: Diagnosis not present

## 2015-09-25 MED ORDER — CYANOCOBALAMIN 1000 MCG/ML IJ SOLN
1000.0000 ug | Freq: Once | INTRAMUSCULAR | Status: AC
  Administered 2015-09-25: 1000 ug via INTRAMUSCULAR

## 2015-09-25 NOTE — Progress Notes (Signed)
Patient ID: Cathy Jimenez, female   DOB: 30-Jan-1981, 35 y.o.   MRN: [id number] Pt presents for weight, B/P, B-12 injection. No side effects of medication-Phentermine, or B-12.  Weight stable.  Encouraged eating healthy and exercise. Pt was admitted to hospital on 09/16/2015 for was she described as a panic attack that lasted for about 4hrs. Pt admitted for chest pain. Cardiac work up in progress. Wore heart monitor but results not in yet and is to have echocardiogram on the 23rd. Pt states she has not taken phentermine and was advised not to. After cardiology work up pt encouraged to make an appt to see MNS or she may see her at annual visit. Pt does desire to continue B-12 injections.

## 2015-10-23 ENCOUNTER — Ambulatory Visit (INDEPENDENT_AMBULATORY_CARE_PROVIDER_SITE_OTHER): Payer: BLUE CROSS/BLUE SHIELD | Admitting: Obstetrics and Gynecology

## 2015-10-23 MED ORDER — CYANOCOBALAMIN 1000 MCG/ML IJ SOLN
1000.0000 ug | Freq: Once | INTRAMUSCULAR | Status: AC
  Administered 2015-10-23: 1000 ug via INTRAMUSCULAR

## 2015-10-23 NOTE — Progress Notes (Signed)
Patient ID: Cathy Jimenez, female   DOB: [birth year], 35 y.o.   MRN: [id number] Pt presents for weight, B/P, B-12 injection. No side effects of medication or B-12.  Weight gain of 1 lbs. Encouraged eating healthy and exercise. Pt states that there is nothing wrong with her heart, everything was clear. Will be starting phentermine 1/2 tab to begin with and see how she does. Pt hopes she can find her prescription. To contact office if needed.

## 2015-11-20 ENCOUNTER — Ambulatory Visit (INDEPENDENT_AMBULATORY_CARE_PROVIDER_SITE_OTHER): Payer: BLUE CROSS/BLUE SHIELD | Admitting: Obstetrics and Gynecology

## 2015-11-20 MED ORDER — CYANOCOBALAMIN 1000 MCG/ML IJ SOLN: 1000.0000 ug | Freq: Once | INTRAMUSCULAR | Status: DC

## 2015-11-20 MED ORDER — CYANOCOBALAMIN 1000 MCG/ML IJ SOLN
1000.0000 ug | Freq: Once | INTRAMUSCULAR | Status: AC
  Administered 2015-11-20: 1000 ug via INTRAMUSCULAR

## 2015-11-20 NOTE — Progress Notes (Signed)
Patient ID: Cathy Jimenez, female   DOB: 30-Sep-1980, 35 y.o.   MRN: [id number] Pt presents for weight, B/P, B-12 injection. No side effects of medication-Phentermine, or B-12.  Weight gain of 2 lbs. Encouraged eating healthy and exercise. Pt states she has not been taking her phenetemine much.

## 2015-12-04 ENCOUNTER — Encounter: Payer: Self-pay | Admitting: Obstetrics and Gynecology

## 2015-12-20 ENCOUNTER — Encounter: Payer: Self-pay | Admitting: Obstetrics and Gynecology

## 2016-07-22 DIAGNOSIS — I10 Essential (primary) hypertension: Secondary | ICD-10-CM | POA: Diagnosis not present

## 2016-07-23 DIAGNOSIS — Z0000 Encounter for general adult medical examination without abnormal findings: Secondary | ICD-10-CM | POA: Diagnosis not present

## 2016-10-07 ENCOUNTER — Encounter: Payer: Self-pay | Admitting: Obstetrics and Gynecology

## 2016-10-07 ENCOUNTER — Ambulatory Visit (INDEPENDENT_AMBULATORY_CARE_PROVIDER_SITE_OTHER): Payer: 59 | Admitting: Obstetrics and Gynecology

## 2016-10-07 VITALS — BP 114/82 | HR 63 | Ht 65.0 in | Wt 299.7 lb

## 2016-10-07 DIAGNOSIS — Z01419 Encounter for gynecological examination (general) (routine) without abnormal findings: Secondary | ICD-10-CM

## 2016-10-07 DIAGNOSIS — Z30431 Encounter for routine checking of intrauterine contraceptive device: Secondary | ICD-10-CM

## 2016-10-07 NOTE — Patient Instructions (Signed)
 Preventive Care 18-39 Years, Female Preventive care refers to lifestyle choices and visits with your health care provider that can promote health and wellness. What does preventive care include?  A yearly physical exam. This is also called an annual well check.  Dental exams once or twice a year.  Routine eye exams. Ask your health care provider how often you should have your eyes checked.  Personal lifestyle choices, including:  Daily care of your teeth and gums.  Regular physical activity.  Eating a healthy diet.  Avoiding tobacco and drug use.  Limiting alcohol use.  Practicing safe sex.  Taking vitamin and mineral supplements as recommended by your health care provider. What happens during an annual well check? The services and screenings done by your health care provider during your annual well check will depend on your age, overall health, lifestyle risk factors, and family history of disease. Counseling  Your health care provider may ask you questions about your:  Alcohol use.  Tobacco use.  Drug use.  Emotional well-being.  Home and relationship well-being.  Sexual activity.  Eating habits.  Work and work environment.  Method of birth control.  Menstrual cycle.  Pregnancy history. Screening  You may have the following tests or measurements:  Height, weight, and BMI.  Diabetes screening. This is done by checking your blood sugar (glucose) after you have not eaten for a while (fasting).  Blood pressure.  Lipid and cholesterol levels. These may be checked every 5 years starting at age 20.  Skin check.  Hepatitis C blood test.  Hepatitis B blood test.  Sexually transmitted disease (STD) testing.  BRCA-related cancer screening. This may be done if you have a family history of breast, ovarian, tubal, or peritoneal cancers.  Pelvic exam and Pap test. This may be done every 3 years starting at age 21. Starting at age 30, this may be done  every 5 years if you have a Pap test in combination with an HPV test. Discuss your test results, treatment options, and if necessary, the need for more tests with your health care provider. Vaccines  Your health care provider may recommend certain vaccines, such as:  Influenza vaccine. This is recommended every year.  Tetanus, diphtheria, and acellular pertussis (Tdap, Td) vaccine. You may need a Td booster every 10 years.  Varicella vaccine. You may need this if you have not been vaccinated.  HPV vaccine. If you are 26 or younger, you may need three doses over 6 months.  Measles, mumps, and rubella (MMR) vaccine. You may need at least one dose of MMR. You may also need a second dose.  Pneumococcal 13-valent conjugate (PCV13) vaccine. You may need this if you have certain conditions and were not previously vaccinated.  Pneumococcal polysaccharide (PPSV23) vaccine. You may need one or two doses if you smoke cigarettes or if you have certain conditions.  Meningococcal vaccine. One dose is recommended if you are age 19-21 years and a first-year college student living in a residence hall, or if you have one of several medical conditions. You may also need additional booster doses.  Hepatitis A vaccine. You may need this if you have certain conditions or if you travel or work in places where you may be exposed to hepatitis A.  Hepatitis B vaccine. You may need this if you have certain conditions or if you travel or work in places where you may be exposed to hepatitis B.  Haemophilus influenzae type b (Hib) vaccine. You may need   this if you have certain risk factors. Talk to your health care provider about which screenings and vaccines you need and how often you need them. This information is not intended to replace advice given to you by your health care provider. Make sure you discuss any questions you have with your health care provider. Document Released: 08/25/2001 Document Revised:  03/18/2016 Document Reviewed: 04/30/2015 Elsevier Interactive Patient Education  2017 Elsevier Inc.  

## 2016-10-07 NOTE — Progress Notes (Signed)
Subjective:   Cathy Jimenez is a 36 y.o. G36P1011 African American female here for a routine well-woman exam.  No LMP recorded. Patient is not currently having periods (Reason: IUD).    Current complaints: regained some of the weight, but has restarted exercise. changed jobs and less stressful now. PCP: Linthanvong       Doesn't need labs, PCP did them in feb.  Social History: Sexual: not sexually active Marital Status: divorced Living situation: alone Occupation: Production designer, theatre/television/film at [employer] in [place] Tobacco/alcohol: no tobacco use Illicit drugs: no history of illicit drug use  The following portions of the patient's history were reviewed and updated as appropriate: allergies, current medications, past family history, past medical history, past social history, past surgical history and problem list.  Past Medical History Past Medical History:  Diagnosis Date  . Anxiety   . Hypertension   . Obesity     Past Surgical History Past Surgical History:  Procedure Laterality Date  . CESAREAN SECTION  2010   [facility]    Gynecologic History G2P1011  No LMP recorded. Patient is not currently having periods (Reason: IUD). Contraception: abstinence Last Pap: 2016. Results were: normal   Obstetric History OB History  Gravida Para Term Preterm AB Living  2 1 1   1 1   SAB TAB Ectopic Multiple Live Births  1       1    # Outcome Date GA Lbr Len/2nd Weight Sex Delivery Anes PTL Lv  2 SAB 2011          1 Term 2010    F Vag-Spont   LIV      Current Medications Current Outpatient Prescriptions on File Prior to Visit  Medication Sig Dispense Refill  . citalopram (CELEXA) 40 MG tablet Take by mouth.    . fluticasone (FLONASE) 50 MCG/ACT nasal spray Place into the nose.    . hydrochlorothiazide (HYDRODIURIL) 25 MG tablet Take 25 mg by mouth daily.    Marland Kitchen levonorgestrel (MIRENA) 20 MCG/24HR IUD 1 each by Intrauterine route once.    . naproxen (NAPROSYN) 500 MG tablet Take 500 mg by mouth 2  (two) times daily with a meal. Reported on 09/16/2015    . cyanocobalamin (,VITAMIN B-12,) 1000 MCG/ML injection Inject 1 mL (1,000 mcg total) into the muscle once. (Patient not taking: Reported on 10/07/2016) 3 mL 2   No current facility-administered medications on file prior to visit.     Review of Systems Patient denies any headaches, blurred vision, shortness of breath, chest pain, abdominal pain, problems with bowel movements, urination, or intercourse.  Objective:  BP 114/82   Pulse 63   Ht 5\' 5"  (1.651 m)   Wt 299 lb 11.2 oz (135.9 kg)   BMI 49.87 kg/m  Physical Exam  General:  Well developed, well nourished, no acute distress. She is alert and oriented x3. Skin:  Warm and dry Neck:  Midline trachea, no thyromegaly or nodules Cardiovascular: Regular rate and rhythm, no murmur heard Lungs:  Effort normal, all lung fields clear to auscultation bilaterally Breasts:  No dominant palpable mass, retraction, or nipple discharge Abdomen:  Soft, non tender, no hepatosplenomegaly or masses Pelvic:  External genitalia is normal in appearance.  The vagina is normal in appearance. The cervix is bulbous, no CMT, IUD string barely visible inside cervix..  Thin prep pap is done with HR HPV cotesting. Uterus is felt to be normal size, shape, and contour.  No adnexal masses or tenderness noted. Extremities:  No  swelling or varicosities noted Psych:  She has a normal mood and affect  Assessment:   Healthy well-woman exam Amenorrhea secondary to Mirena use Obesity IUD check  Plan:  Pap only obtained. Encouraged exercise and weight loss. F/U 1 year for AE, or sooner if needed   Shukri Nistler Suzan NailerN Gwendalyn Mcgonagle, CNM

## 2016-10-22 DIAGNOSIS — Z0000 Encounter for general adult medical examination without abnormal findings: Secondary | ICD-10-CM | POA: Diagnosis not present

## 2016-10-22 DIAGNOSIS — I10 Essential (primary) hypertension: Secondary | ICD-10-CM | POA: Diagnosis not present

## 2016-11-26 ENCOUNTER — Ambulatory Visit: Payer: Commercial Managed Care - HMO | Attending: Internal Medicine

## 2016-11-26 DIAGNOSIS — R5383 Other fatigue: Secondary | ICD-10-CM | POA: Diagnosis not present

## 2016-11-26 DIAGNOSIS — G4733 Obstructive sleep apnea (adult) (pediatric): Secondary | ICD-10-CM | POA: Insufficient documentation

## 2016-11-26 DIAGNOSIS — G4719 Other hypersomnia: Secondary | ICD-10-CM | POA: Diagnosis present

## 2016-11-26 DIAGNOSIS — G4730 Sleep apnea, unspecified: Secondary | ICD-10-CM | POA: Diagnosis not present

## 2016-12-17 ENCOUNTER — Ambulatory Visit (INDEPENDENT_AMBULATORY_CARE_PROVIDER_SITE_OTHER): Payer: Commercial Managed Care - HMO | Admitting: Certified Nurse Midwife

## 2016-12-17 ENCOUNTER — Encounter: Payer: Self-pay | Admitting: Certified Nurse Midwife

## 2016-12-17 VITALS — BP 112/76 | HR 64 | Ht 65.0 in | Wt 302.3 lb

## 2016-12-17 DIAGNOSIS — N368 Other specified disorders of urethra: Secondary | ICD-10-CM

## 2016-12-17 MED ORDER — CLINDAMYCIN HCL 300 MG PO CAPS: 300.0000 mg | ORAL_CAPSULE | Freq: Three times a day (TID) | ORAL | 0 refills | Status: AC

## 2016-12-17 MED ORDER — LIDOCAINE HCL 2 % EX GEL: 1.0000 "application " | CUTANEOUS | 2 refills | Status: DC | PRN

## 2016-12-17 NOTE — Patient Instructions (Signed)
Fluconazole tablets What is this medicine? FLUCONAZOLE (floo KON na zole) is an antifungal medicine. It is used to treat certain kinds of fungal or yeast infections. This medicine may be used for other purposes; ask your health care provider or pharmacist if you have questions. COMMON BRAND NAME(S): Diflucan What should I tell my health care provider before I take this medicine? They need to know if you have any of these conditions: -history of irregular heart beat -kidney disease -an unusual or allergic reaction to fluconazole, other azole antifungals, medicines, foods, dyes, or preservatives -pregnant or trying to get pregnant -breast-feeding How should I use this medicine? Take this medicine by mouth. Follow the directions on the prescription label. Do not take your medicine more often than directed. Talk to your pediatrician regarding the use of this medicine in children. Special care may be needed. This medicine has been used in children as young as 6 months of age. Overdosage: If you think you have taken too much of this medicine contact a poison control center or emergency room at once. NOTE: This medicine is only for you. Do not share this medicine with others. What if I miss a dose? If you miss a dose, take it as soon as you can. If it is almost time for your next dose, take only that dose. Do not take double or extra doses. What may interact with this medicine? Do not take this medicine with any of the following medications: -astemizole -certain medicines for irregular heart beat like dofetilide, dronedarone, quinidine -cisapride -erythromycin -lomitapide -other medicines that prolong the QT interval (cause an abnormal heart rhythm) -pimozide -terfenadine -thioridazine -tolvaptan -ziprasidone This medicine may also interact with the following medications: -antiviral medicines for HIV or AIDS -birth control pills -certain antibiotics like rifabutin, rifampin -certain  medicines for blood pressure like amlodipine, isradipine, felodipine, hydrochlorothiazide, losartan, nifedipine -certain medicines for cancer like cyclophosphamide, vinblastine, vincristine -certain medicines for cholesterol like atorvastatin, lovastatin, fluvastatin, simvastatin -certain medicines for depression, anxiety, or psychotic disturbances like amitriptyline, midazolam, nortriptyline, triazolam -certain medicines for diabetes like glipizide, glyburide, tolbutamide -certain medicines for pain like alfentanil, fentanyl, methadone -certain medicines for seizures like carbamazepine, phenytoin -certain medicines that treat or prevent blood clots like warfarin -halofantrine -medicines that lower your chance of fighting infection like cyclosporine, prednisone, tacrolimus -NSAIDS, medicines for pain and inflammation, like celecoxib, diclofenac, flurbiprofen, ibuprofen, meloxicam, naproxen -other medicines for fungal infections -sirolimus -theophylline -tofacitinib This list may not describe all possible interactions. Give your health care provider a list of all the medicines, herbs, non-prescription drugs, or dietary supplements you use. Also tell them if you smoke, drink alcohol, or use illegal drugs. Some items may interact with your medicine. What should I watch for while using this medicine? Visit your doctor or health care professional for regular checkups. If you are taking this medicine for a long time you may need blood work. Tell your doctor if your symptoms do not improve. Some fungal infections need many weeks or months of treatment to cure. Alcohol can increase possible damage to your liver. Avoid alcoholic drinks. If you have a vaginal infection, do not have sex until you have finished your treatment. You can wear a sanitary napkin. Do not use tampons. Wear freshly washed cotton, not synthetic, panties. What side effects may I notice from receiving this medicine? Side effects that  you should report to your doctor or health care professional as soon as possible: -allergic reactions like skin rash or itching, hives, swelling of the   lips, mouth, tongue, or throat -dark urine -feeling dizzy or faint -irregular heartbeat or chest pain -redness, blistering, peeling or loosening of the skin, including inside the mouth -trouble breathing -unusual bruising or bleeding -vomiting -yellowing of the eyes or skin Side effects that usually do not require medical attention (report to your doctor or health care professional if they continue or are bothersome): -changes in how food tastes -diarrhea -headache -stomach upset or nausea This list may not describe all possible side effects. Call your doctor for medical advice about side effects. You may report side effects to FDA at 1-800-FDA-1088. Where should I keep my medicine? Keep out of the reach of children. Store at room temperature below 30 degrees C (86 degrees F). Throw away any medicine after the expiration date. NOTE: This sheet is a summary. It may not cover all possible information. If you have questions about this medicine, talk to your doctor, pharmacist, or health care provider.  2018 Elsevier/Gold Standard (2013-02-04 19:37:38) Clindamycin capsules What is this medicine? CLINDAMYCIN (KLIN da MYE sin) is a lincosamide antibiotic. It is used to treat certain kinds of bacterial infections. It will not work for colds, flu, or other viral infections. This medicine may be used for other purposes; ask your health care provider or pharmacist if you have questions. COMMON BRAND NAME(S): Cleocin What should I tell my health care provider before I take this medicine? They need to know if you have any of these conditions: -kidney disease -liver disease -stomach problems like colitis -an unusual or allergic reaction to clindamycin, lincomycin, or other medicines, foods, dyes like tartrazine or preservatives -pregnant or trying  to get pregnant -breast-feeding How should I use this medicine? Take this medicine by mouth with a full glass of water. Follow the directions on the prescription label. You can take this medicine with food or on an empty stomach. If the medicine upsets your stomach, take it with food. Take your medicine at regular intervals. Do not take your medicine more often than directed. Take all of your medicine as directed even if you think your are better. Do not skip doses or stop your medicine early. Talk to your pediatrician regarding the use of this medicine in children. Special care may be needed. Overdosage: If you think you have taken too much of this medicine contact a poison control center or emergency room at once. NOTE: This medicine is only for you. Do not share this medicine with others. What if I miss a dose? If you miss a dose, take it as soon as you can. If it is almost time for your next dose, take only that dose. Do not take double or extra doses. What may interact with this medicine? -birth control pills -erythromycin -medicines that relax muscles for surgery -rifampin This list may not describe all possible interactions. Give your health care provider a list of all the medicines, herbs, non-prescription drugs, or dietary supplements you use. Also tell them if you smoke, drink alcohol, or use illegal drugs. Some items may interact with your medicine. What should I watch for while using this medicine? Tell your doctor or healthcare professional if your symptoms do not start to get better or if they get worse. Do not treat diarrhea with over the counter products. Contact your doctor if you have diarrhea that lasts more than 2 days or if it is severe and watery. What side effects may I notice from receiving this medicine? Side effects that you should report to your doctor  or health care professional as soon as possible: -allergic reactions like skin rash, itching or hives, swelling of the  face, lips, or tongue -dark urine -pain on swallowing -redness, blistering, peeling or loosening of the skin, including inside the mouth -unusual bleeding or bruising -unusually weak or tired -yellowing of eyes or skin Side effects that usually do not require medical attention (report to your doctor or health care professional if they continue or are bothersome): -diarrhea -itching in the rectal or genital area -joint pain -nausea, vomiting -stomach pain This list may not describe all possible side effects. Call your doctor for medical advice about side effects. You may report side effects to FDA at 1-800-FDA-1088. Where should I keep my medicine? Keep out of the reach of children. Store at room temperature between 20 and 25 degrees C (68 and 77 degrees F). Throw away any unused medicine after the expiration date. NOTE: This sheet is a summary. It may not cover all possible information. If you have questions about this medicine, talk to your doctor, pharmacist, or health care provider.  2018 Elsevier/Gold Standard (2015-10-02 16:34:00) Bartholin Cyst or Abscess A Bartholin cyst is a fluid-filled sac that forms on a Bartholin gland. Bartholin glands are small glands that are located within the folds of skin (labia) along the sides of the lower opening of the vagina. These glands produce a fluid to moisten the outside of the vagina during sexual intercourse. A Bartholin cyst causes a bulge on the side of the vagina. A cyst that is not large or infected may not cause symptoms or problems. However, if the fluid within the cyst becomes infected, the cyst can turn into an abscess. An abscess may cause discomfort or pain. What are the causes? A Bartholin cyst may develop when the duct of the gland becomes blocked. In many cases, the cause of this is not known. Various kinds of bacteria can cause the cyst to become infected and develop into an abscess. What increases the risk? You may be at an  increased risk of developing a Bartholin cyst or abscess if:  You are a woman of reproductive age.  You have a history of previous Bartholin cysts or abscesses.  You have diabetes.  You have a sexually transmitted disease (STD).  What are the signs or symptoms? The severity of symptoms varies depending on the size of the cyst and whether it is infected. Symptoms may include:  A bulge or swelling near the lower opening of your vagina.  Discomfort or pain.  Redness.  Pain during sexual intercourse.  Pain when walking.  Fluid draining from the area.  How is this diagnosed? Your health care provider may make a diagnosis based on your symptoms and a physical exam. He or she will look for swelling in your vaginal area. Blood tests may be done to check for infections. A sample of fluid from the cyst or abscess may also be taken to be tested in a lab. How is this treated? Small cysts that are not infected may not require any treatment. These often go away on their own. Yourhealth care provider will recommend hot baths and the use of warm compresses. These may also be part of the treatment for an abscess. Treatment options for a large cyst or abscess may include:  Antibiotic medicine.  A surgical procedure to drain the abscess. One of the following procedures may be done: ? Incision and drainage. An incision is made in the cyst or abscess so that the  fluid drains out. A catheter may be placed inside the cyst so that it does not close and fill up with fluid again. The catheter will be removed after you have a follow-up visit with a specialist (gynecologist). ? Marsupialization. The cyst or abscess is opened and kept open by stitching the edges of the skin to the walls of the cyst or abscess. This allows it to continue to drain and not fill up with fluid again.  If you have cysts or abscesses that keep returning and have required incision and drainage multiple times, your health care  provider may talk to you about surgery to remove the Bartholin gland. Follow these instructions at home:  Take medicines only as directed by your health care provider.  If you were prescribed an antibiotic medicine, finish it all even if you start to feel better.  Apply warm, wet compresses to the area or take warm, shallow baths that cover your pelvic region (sitz baths) several times a day or as directed by your health care provider.  Do not squeeze the cyst or apply heavy pressure to it.  Do not have sexual intercourse until the cyst has gone away.  If your cyst or abscess was opened, a small piece of gauze or a drain may have been placed in the area to allow drainage. Do not remove the gauze or the drain until directed by your health care provider.  Wear feminine pads-not tampons-as needed for any drainage or bleeding.  Keep all follow-up visits as directed by your health care provider. This is important. How is this prevented? Take these steps to help prevent a Bartholin cyst from returning:  Practice good hygiene.  Clean your vaginal area with mild soap and a soft cloth when you bathe.  Practice safe sex to prevent STDs.  Contact a health care provider if:  You have increased pain, swelling, or redness in the area of the cyst.  Puslike drainage is coming from the cyst.  You have a fever. This information is not intended to replace advice given to you by your health care provider. Make sure you discuss any questions you have with your health care provider. Document Released: 06/29/2005 Document Revised: 12/05/2015 Document Reviewed: 02/12/2014 Elsevier Interactive Patient Education  2018 [employer].

## 2016-12-20 NOTE — Progress Notes (Signed)
Camille BalKimberly Irene Korn is a [age] y.o. year old 102P1011 Caucasian female who presents for incision and drainage of left skene's gland abscess. She reports a painful "bump on clitoris like an ingrown hair" for the last two days. She reports minimal relief with home measures.   No LMP recorded. Patient is not currently having periods (Reason: IUD). BP 112/76   Pulse 64   Ht 5\' 5"  (1.651 m)   Wt (!) 302 lb 4.8 oz (137.1 kg)   BMI 50.31 kg/m   Verbal consent was obtained for incision and drainage of left skene's gland abscess. A time out was preformed.   The area was cleansed with betadine and anesthetized with 2cc of 2% Lidocaine. The area was cleansed again with betadine and two (2) "pinprick" incisions were created at the top and bottom of the abscess. No purulent drainage was obtained. Bleeding was controlled with silver nitrate and monsel's solution. A dry gauze dressing was applied.  The patient tolerated the procedure well without complications. Standard post procedure care and return precautions explained.   Rx Clindamycin and Lidocaine jelly, see orders.   RTC x 2-3 days for wound/incision check.    Gunnar BullaJenkins Michelle Lawhorn, CNM

## 2016-12-22 ENCOUNTER — Encounter: Payer: Self-pay | Admitting: Certified Nurse Midwife

## 2016-12-22 ENCOUNTER — Telehealth: Payer: Self-pay

## 2016-12-22 ENCOUNTER — Other Ambulatory Visit: Payer: Self-pay | Admitting: Certified Nurse Midwife

## 2016-12-22 MED ORDER — FLUCONAZOLE 150 MG PO TABS: 150.0000 mg | ORAL_TABLET | Freq: Once | ORAL | 1 refills | Status: AC

## 2016-12-22 NOTE — Telephone Encounter (Signed)
-----   Message from Gunnar BullaJenkins Michelle Lawhorn, CNM sent at 12/22/2016  8:52 AM EDT ----- Please contact patient and let her know that I have sent Rx Diflucan to her pharmacy on file.   Also, let her know that she may cancel her appointment this afternoon if she is feeling better.   Thanks,   JML

## 2016-12-22 NOTE — Telephone Encounter (Signed)
I called pt to f/u skene's glan. Pt states the swelling is down, the knot remains hard but is smaller and not as tender mostly when wipes. No drainage, no fever.  JML aware and pt does not need to come in for f/u appt today if improving. Pt to continue sitz baths, ATB, and use lidocaine as needed.  Pt encourage to contact office if any changes as in worsening. Pt voiced understanding.

## 2016-12-22 NOTE — Telephone Encounter (Signed)
Left message to contact office

## 2017-01-06 DIAGNOSIS — G4730 Sleep apnea, unspecified: Secondary | ICD-10-CM | POA: Diagnosis not present

## 2017-01-06 DIAGNOSIS — G4733 Obstructive sleep apnea (adult) (pediatric): Secondary | ICD-10-CM | POA: Diagnosis not present

## 2017-02-25 DIAGNOSIS — G4733 Obstructive sleep apnea (adult) (pediatric): Secondary | ICD-10-CM | POA: Diagnosis not present

## 2017-03-28 DIAGNOSIS — G4733 Obstructive sleep apnea (adult) (pediatric): Secondary | ICD-10-CM | POA: Diagnosis not present

## 2017-04-22 DIAGNOSIS — I10 Essential (primary) hypertension: Secondary | ICD-10-CM | POA: Diagnosis not present

## 2017-04-27 DIAGNOSIS — G4733 Obstructive sleep apnea (adult) (pediatric): Secondary | ICD-10-CM | POA: Diagnosis not present

## 2017-04-29 DIAGNOSIS — G4733 Obstructive sleep apnea (adult) (pediatric): Secondary | ICD-10-CM | POA: Diagnosis not present

## 2017-04-29 DIAGNOSIS — Z23 Encounter for immunization: Secondary | ICD-10-CM | POA: Diagnosis not present

## 2017-04-29 DIAGNOSIS — I10 Essential (primary) hypertension: Secondary | ICD-10-CM | POA: Diagnosis not present

## 2017-05-28 DIAGNOSIS — G4733 Obstructive sleep apnea (adult) (pediatric): Secondary | ICD-10-CM | POA: Diagnosis not present

## 2017-06-27 DIAGNOSIS — G4733 Obstructive sleep apnea (adult) (pediatric): Secondary | ICD-10-CM | POA: Diagnosis not present

## 2017-07-28 DIAGNOSIS — G4733 Obstructive sleep apnea (adult) (pediatric): Secondary | ICD-10-CM | POA: Diagnosis not present

## 2017-08-18 IMAGING — CR DG CHEST 2V
1 series · 2 of 2 positions shown · non-contrast
Comparison: None.

CLINICAL DATA: Shortness of breath with chest pressure today.
Nonsmoker.

EXAM:
CHEST  2 VIEW

[Series 1: w chest pa · 0.14mm/px · 2 of 2 slices shown]
[im 1/2]
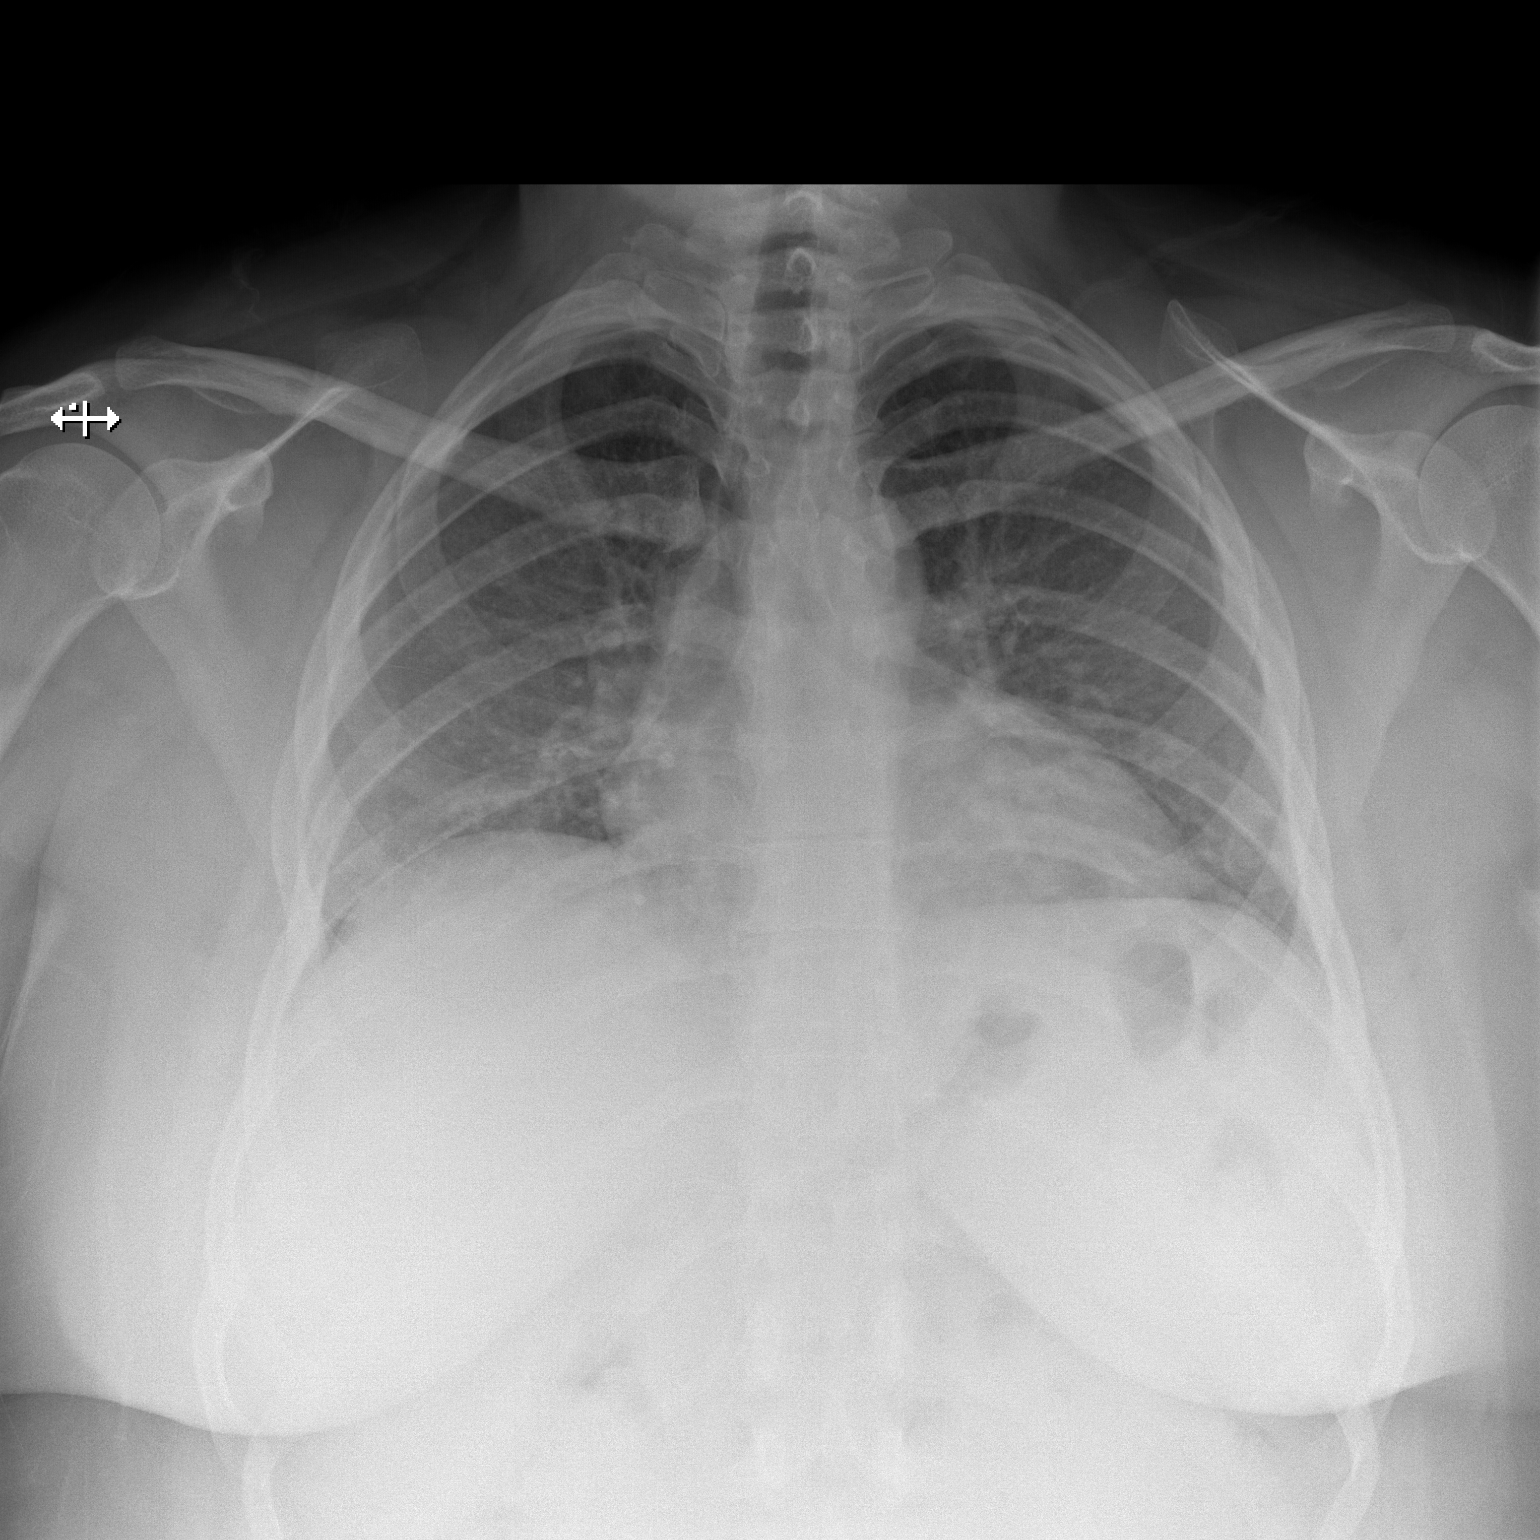
[im 2/2]
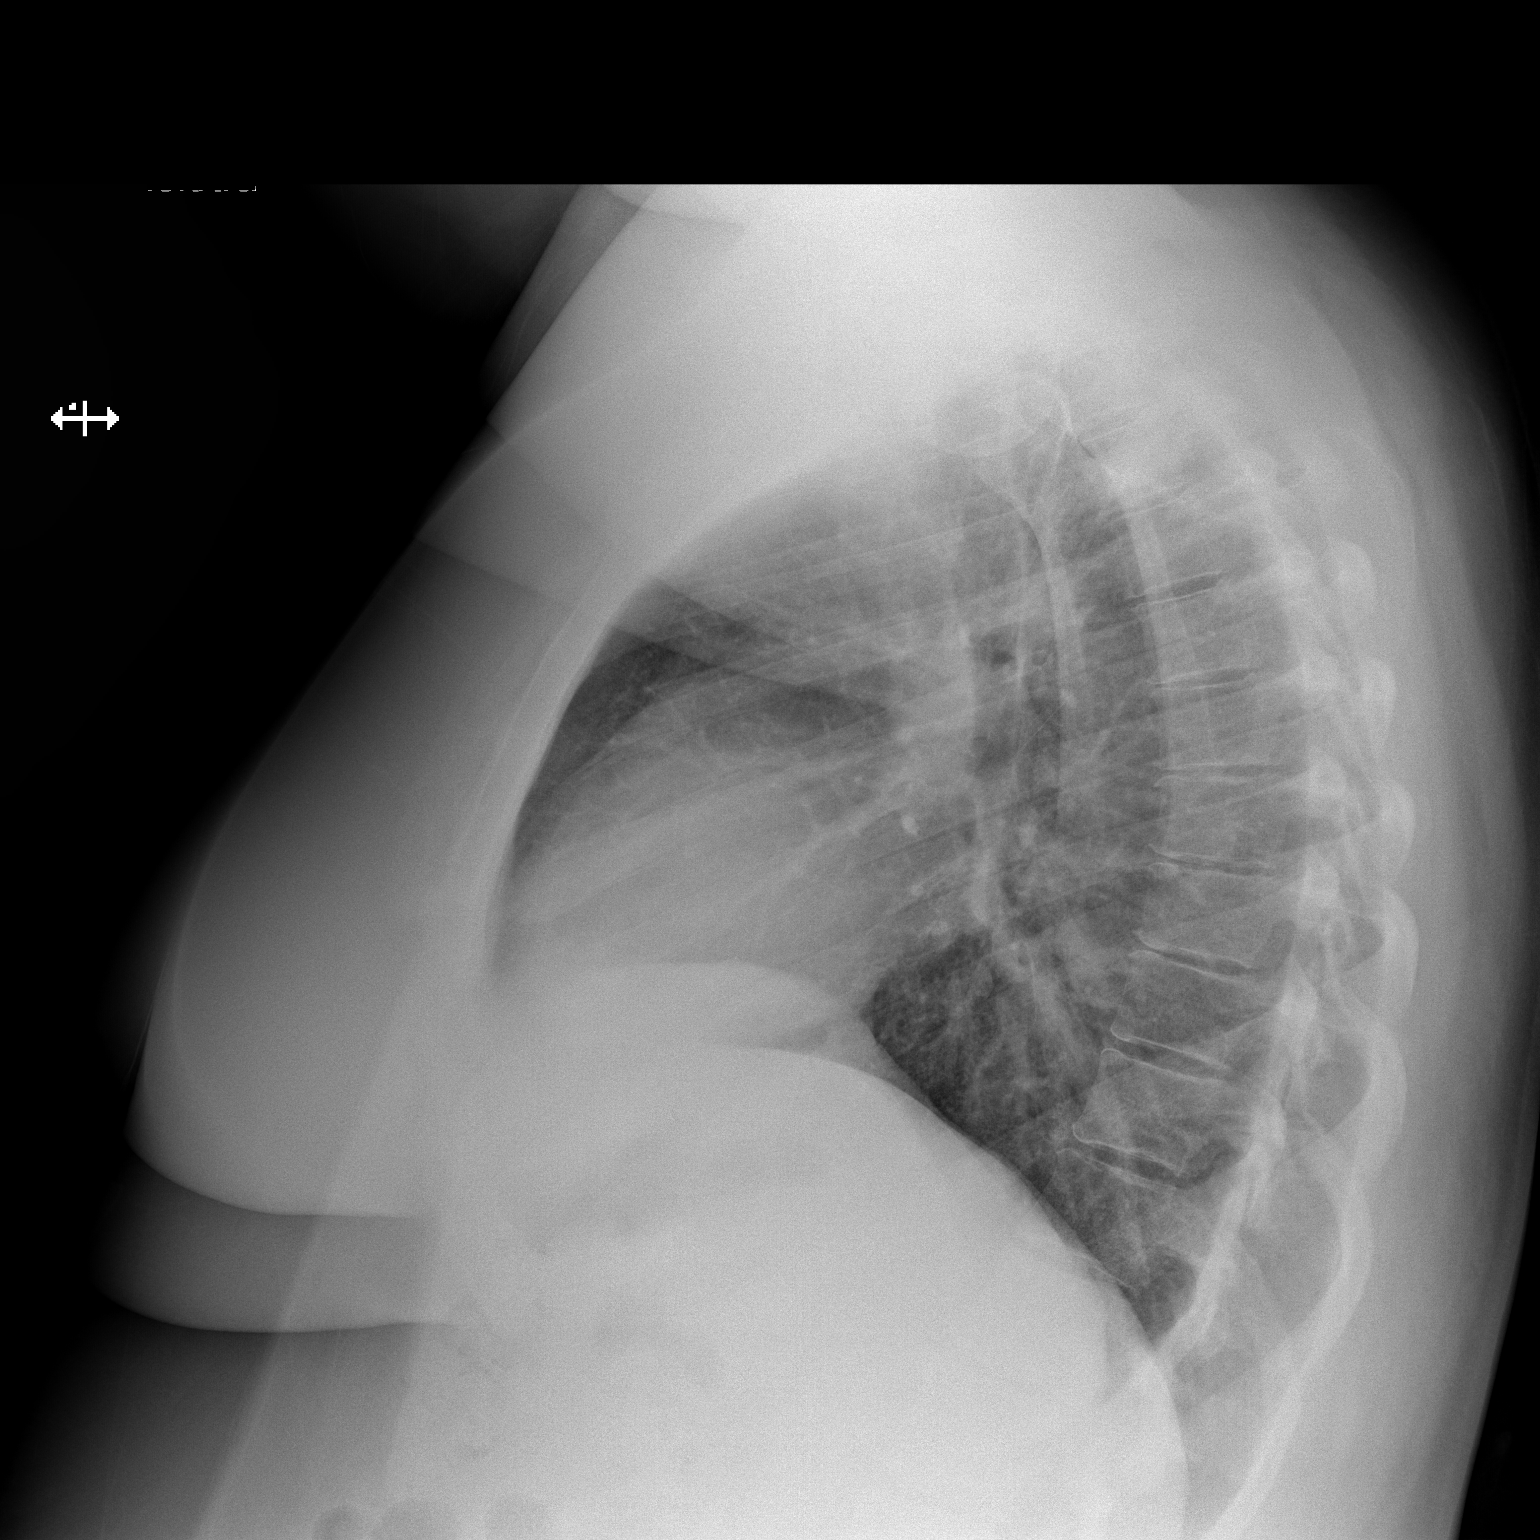

[2 of 2 positions shown; findings below may reference images not displayed]

FINDINGS: Suboptimal inspiration on the frontal examination. The heart size
and mediastinal contours are normal. Patchy opacities at the lung
bases on the frontal examination are attributed to atelectasis and
overlying breast tissue. The lungs appear clear based on the lateral
view. There is no pleural effusion or pneumothorax. No osseous
abnormalities are apparent.
IMPRESSION: No definite active cardiopulmonary process allowing for suboptimal
inspiration on the frontal examination. The lungs appear clear on
the lateral view.

## 2017-08-19 IMAGING — CT CT ANGIO CHEST
2 of 6 series · 19 of 36 positions shown · IV contrast (omnipaque)
Comparison: Radiographs yesterday.

CLINICAL DATA: Chest pain radiating to the back.

EXAM:
CT ANGIOGRAPHY CHEST WITH CONTRAST
TECHNIQUE: Multidetector CT imaging of the chest was performed using the
standard protocol during bolus administration of intravenous
contrast. Multiplanar CT image reconstructions and MIPs were
obtained to evaluate the vascular anatomy.
CONTRAST:  125mL OMNIPAQUE IOHEXOL 350 MG/ML SOLN

[Series 7: arterial · axial · arterial · 0.84mm/px · z∈[-346,-64]mm · 18 of 159 slices shown]
[im 9/159  lung]
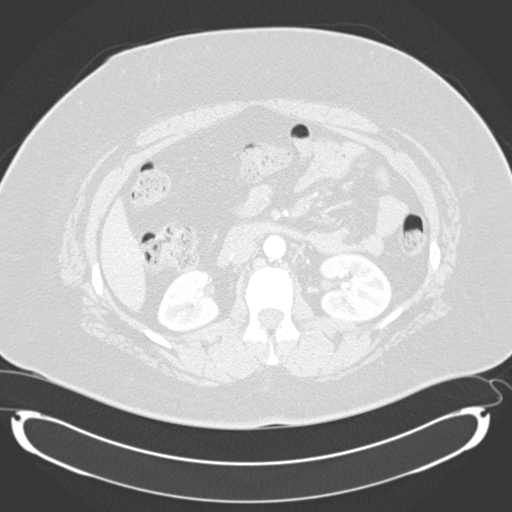
[im 17/159  mediastinal]
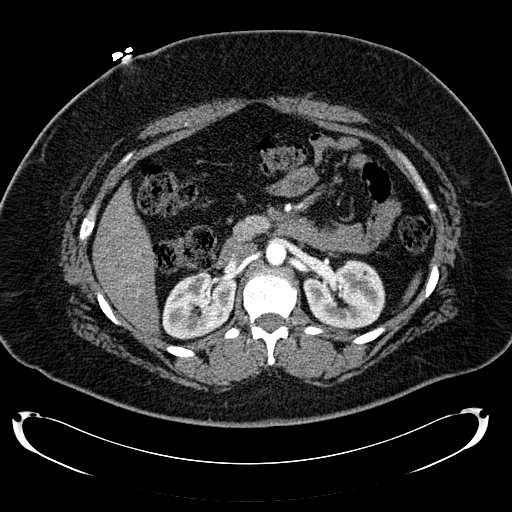
[im 25/159  lung]
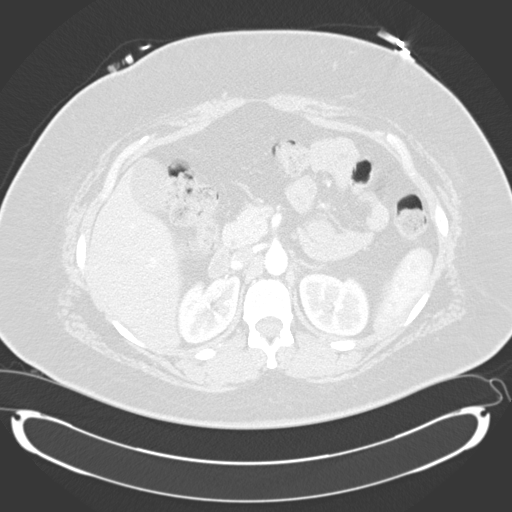
[im 34/159  mediastinal]
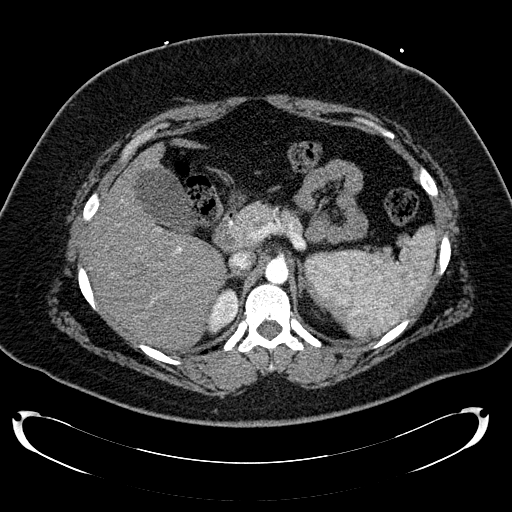
[im 42/159  lung]
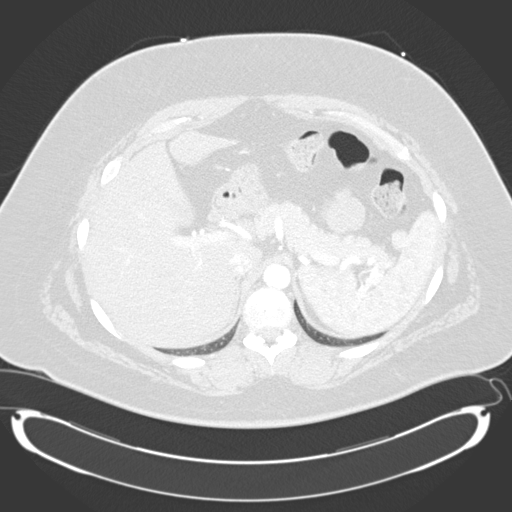
[im 50/159  mediastinal]
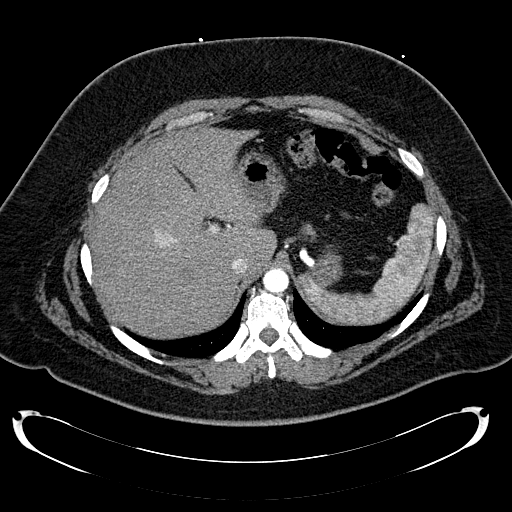
[im 59/159  lung]
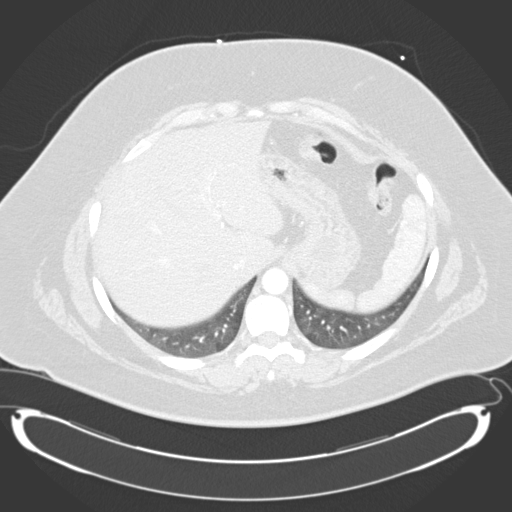
[im 67/159  mediastinal]
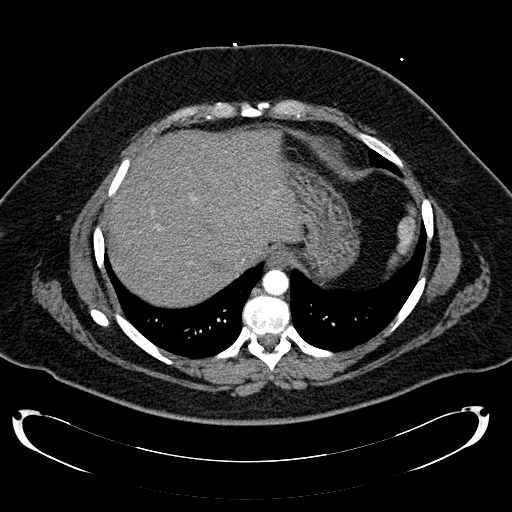
[im 75/159  lung]
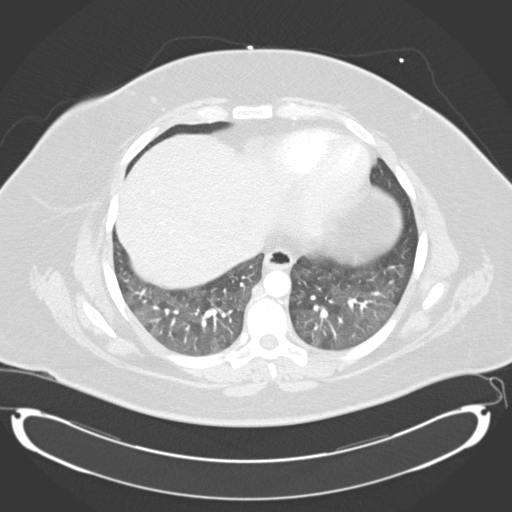
[im 84/159  mediastinal]
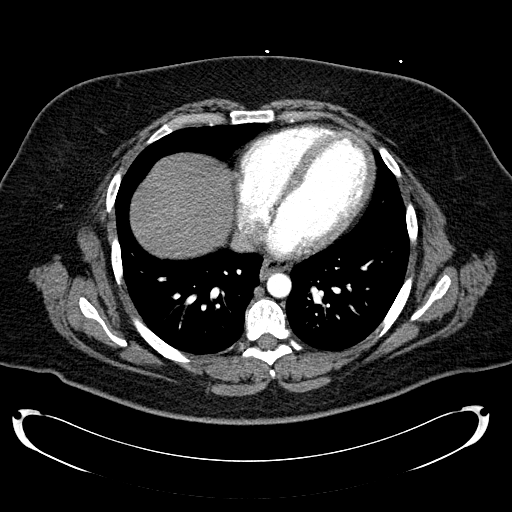
[im 92/159  lung]
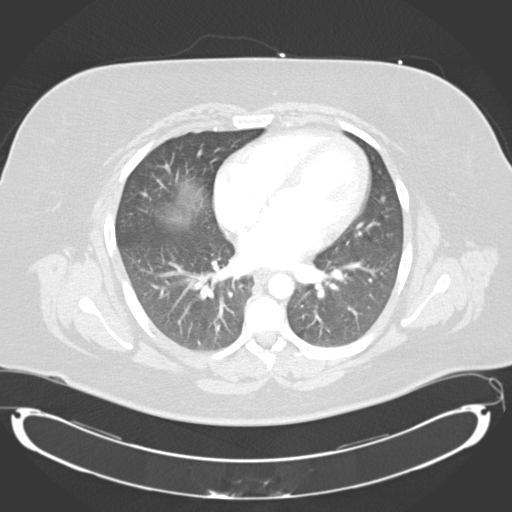
[im 100/159  mediastinal]
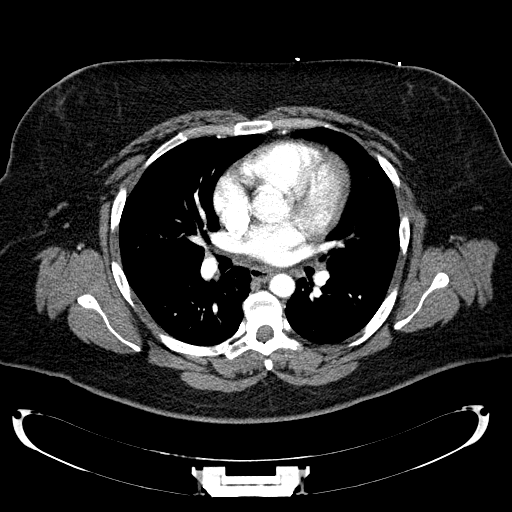
[im 109/159  lung]
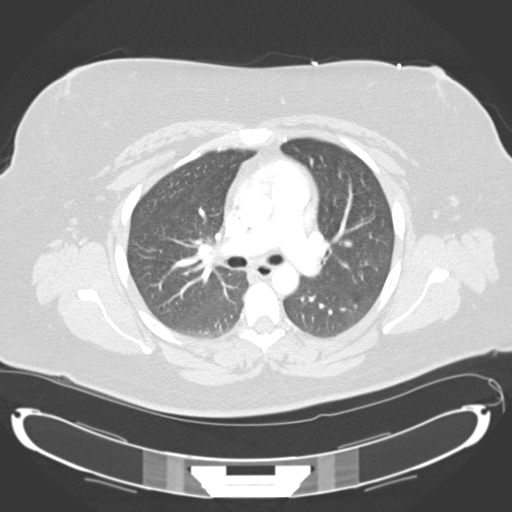
[im 117/159  mediastinal]
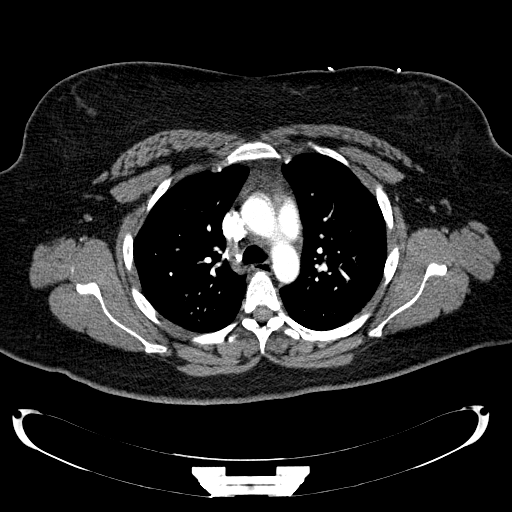
[im 125/159  lung]
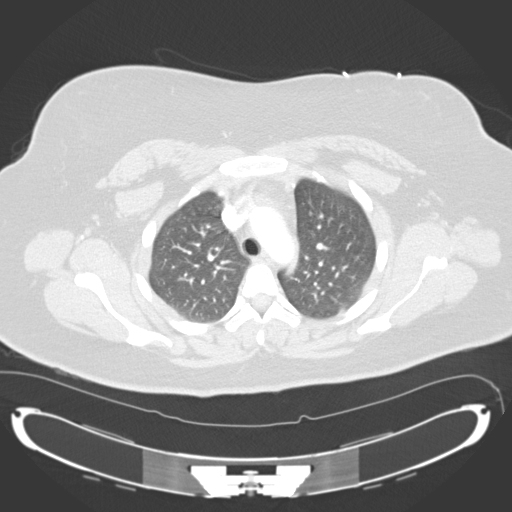
[im 134/159  mediastinal]
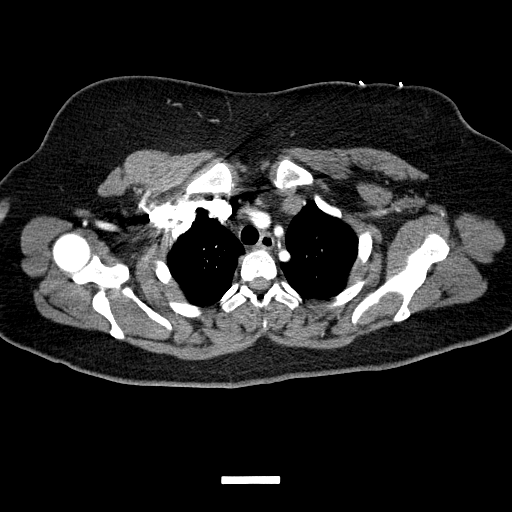
[im 142/159  lung]
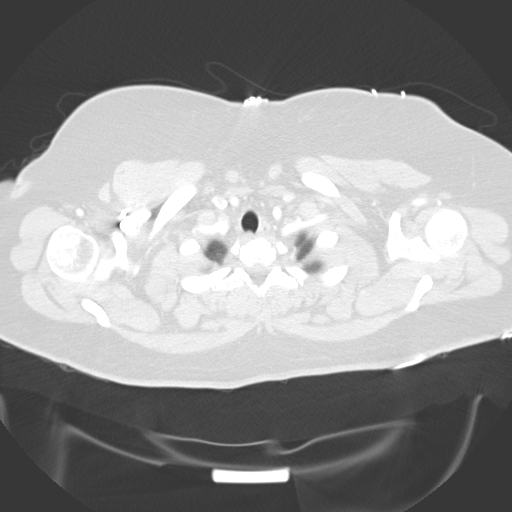
[im 150/159  mediastinal]
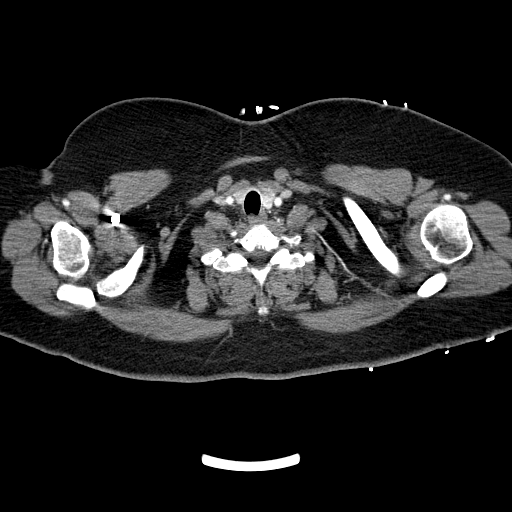

[Series 9: cor arterial mpr · coronal · arterial · 0.62mm/px · 1 of 149 slices shown]
[im 75/149  mediastinal]
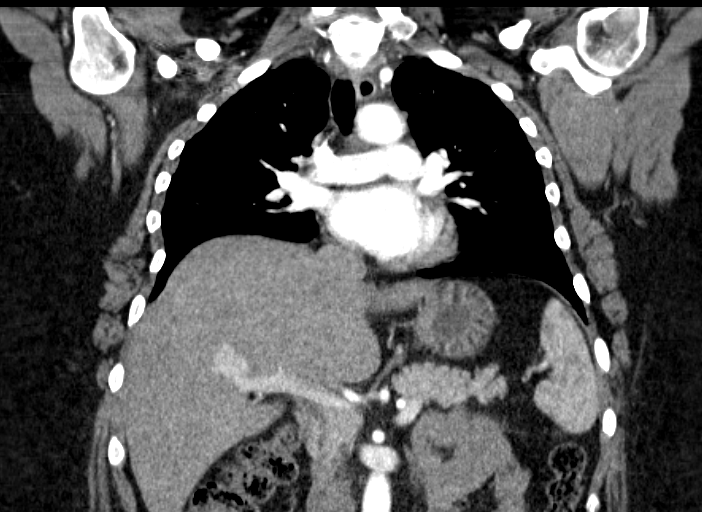

[19 of 36 positions shown; findings below may reference images not displayed]

FINDINGS: Allowing for cardiac motion, no aortic dissection. Thoracic aorta is
normal in caliber. No significant atherosclerosis. No aneurysm or
aortic hematoma. Left vertebral artery arises directly from the
aorta, a normal variant. No filling defects in the central pulmonary
arteries to the segmental level to suggest pulmonary embolus. Heart
is normal in size. No pleural or pericardial effusion. No
mediastinal or hilar adenopathy.

No consolidation, pulmonary edema, mass or suspicious nodule.
Heterogeneous attenuation on postcontrast imaging likely related to
phase of respiration.

Distal esophagus is patulous. No acute abnormality in the included
upper abdomen.

There are no acute or suspicious osseous abnormalities.

Review of the MIP images confirms the above findings.
IMPRESSION: No aortic dissection or acute aortic abnormality. No acute
intrathoracic process.

## 2017-08-28 DIAGNOSIS — G4733 Obstructive sleep apnea (adult) (pediatric): Secondary | ICD-10-CM | POA: Diagnosis not present

## 2017-09-02 ENCOUNTER — Ambulatory Visit (INDEPENDENT_AMBULATORY_CARE_PROVIDER_SITE_OTHER): Payer: 59 | Admitting: Certified Nurse Midwife

## 2017-09-02 ENCOUNTER — Encounter: Payer: Self-pay | Admitting: Certified Nurse Midwife

## 2017-09-02 VITALS — BP 122/78 | HR 70 | Ht 65.0 in | Wt 315.7 lb

## 2017-09-02 DIAGNOSIS — L292 Pruritus vulvae: Secondary | ICD-10-CM | POA: Diagnosis not present

## 2017-09-02 DIAGNOSIS — R102 Pelvic and perineal pain: Secondary | ICD-10-CM | POA: Diagnosis not present

## 2017-09-02 DIAGNOSIS — M545 Low back pain: Secondary | ICD-10-CM

## 2017-09-02 DIAGNOSIS — N898 Other specified noninflammatory disorders of vagina: Secondary | ICD-10-CM | POA: Diagnosis not present

## 2017-09-02 LAB — POCT URINALYSIS DIPSTICK
Bilirubin, UA: NEGATIVE
Blood, UA: NEGATIVE
Glucose, UA: NEGATIVE
Ketones, UA: NEGATIVE
Leukocytes, UA: NEGATIVE
Nitrite, UA: NEGATIVE
Odor: NEGATIVE
Protein, UA: NEGATIVE
Spec Grav, UA: <=1.005 — AB (ref 1.010–1.025)
Urobilinogen, UA: 0.2 E.U./dL
pH, UA: 7.5 (ref 5.0–8.0)

## 2017-09-02 MED ORDER — FLUCONAZOLE 150 MG PO TABS: 150.0000 mg | ORAL_TABLET | ORAL | 0 refills | Status: DC

## 2017-09-02 MED ORDER — SECNIDAZOLE 2 G PO PACK: 2.0000 g | PACK | Freq: Once | ORAL | 0 refills | Status: AC

## 2017-09-02 NOTE — Patient Instructions (Signed)

## 2017-09-04 LAB — URINE CULTURE

## 2017-09-05 LAB — NUSWAB VAGINITIS PLUS (VG+)
Candida albicans, NAA: NEGATIVE
Candida glabrata, NAA: NEGATIVE
Chlamydia trachomatis, NAA: NEGATIVE
Neisseria gonorrhoeae, NAA: NEGATIVE
Trich vag by NAA: NEGATIVE

## 2017-09-14 NOTE — Progress Notes (Signed)
GYN ENCOUNTER NOTE  Subjective:       Cathy Jimenez is a 37 y.o. 732P1011 female is here for gynecologic evaluation of the following issues:  1. Vaginal discharge 2. Vulvar itching 3. Pelvic pain 4. Left sided low back pain with sciatic  Reports vaginal discharge for the last three (3) days with intermittent vulvar itching and pelvic pain. She has also noticed left sided low back pain with sciatic. No relief with home treatment measures.   Denies difficulty breathing or respiratory distress, chest pain, abdominal pain, vaginal bleeding, dysuria, and leg pain or swelling.    Gynecologic History  No LMP recorded (lmp unknown). Patient is not currently having periods (Reason: IUD).  Contraception: IUD, Mirena  Last Pap: 10/07/2017.  Obstetric History  OB History  Gravida Para Term Preterm AB Living  2 1 1   1 1   SAB TAB Ectopic Multiple Live Births  1       1    # Outcome Date GA Lbr Len/2nd Weight Sex Delivery Anes PTL Lv  2 SAB 2011          1 Term 2010    F Vag-Spont   LIV      Past Medical History:  Diagnosis Date  . Anxiety   . Hypertension   . Obesity     Past Surgical History:  Procedure Laterality Date  . CESAREAN SECTION  2010   PIH    Current Outpatient Medications on File Prior to Visit  Medication Sig Dispense Refill  . citalopram (CELEXA) 40 MG tablet Take by mouth.    . fluticasone (FLONASE) 50 MCG/ACT nasal spray Place into the nose.    . hydrochlorothiazide (HYDRODIURIL) 25 MG tablet Take 25 mg by mouth daily.    Marland Kitchen. levonorgestrel (MIRENA) 20 MCG/24HR IUD 1 each by Intrauterine route once.    . naproxen (NAPROSYN) 500 MG tablet Take 500 mg by mouth 2 (two) times daily with a meal. Reported on 09/16/2015     No current facility-administered medications on file prior to visit.     No Known Allergies  Social History   Socioeconomic History  . Marital status: Married    Spouse name: Not on file  . Number of children: Not on file  . Years  of education: Not on file  . Highest education level: Not on file  Social Needs  . Financial resource strain: Not on file  . Food insecurity - worry: Not on file  . Food insecurity - inability: Not on file  . Transportation needs - medical: Not on file  . Transportation needs - non-medical: Not on file  Occupational History  . Not on file  Tobacco Use  . Smoking status: Former Games developermoker  . Smokeless tobacco: Never Used  Substance and Sexual Activity  . Alcohol use: Yes    Comment: OCCAS  . Drug use: No  . Sexual activity: Yes    Birth control/protection: IUD    Comment: MIRENA  Other Topics Concern  . Not on file  Social History Narrative  . Not on file    Family History  Problem Relation Age of Onset  . Heart disease Father   . Hypertension Father   . Diabetes Maternal Grandmother   . Heart disease Maternal Grandmother   . Cancer Maternal Grandfather        LIVER    The following portions of the patient's history were reviewed and updated as appropriate: allergies, current medications, past family history, past  medical history, past social history, past surgical history and problem list.  Review of Systems  Review of Systems - Negative except as noted above. History obtained from the patient.  Objective:   BP 122/78   Pulse 70   Ht 5\' 5"  (1.651 m)   Wt (!) 315 lb 11.2 oz (143.2 kg)   LMP  (LMP Unknown) Comment: spotting occas  BMI 52.54 kg/m    CONSTITUTIONAL: Well-developed, well-nourished female in no acute distress.   HENT:  Normocephalic, atraumatic.   SKIN: Skin is warm and dry. No rash noted. Not diaphoretic. No erythema. No pallor.  NEUROLGIC: Alert and oriented to person, place, and time.   PSYCHIATRIC: Normal mood and affect. Normal behavior. Normal judgment and thought content.  ABDOMEN: Soft, non distended; Non tender.  No Organomegaly. Obese.   PELVIC:  External Genitalia: Normal  Vagina: Normal  Cervix: Normal, IUD strings  present  Uterus: Normal size, shape,consistency, mobile  Adnexa: Normal  MUSCULOSKELETAL: Normal range of motion. No tenderness.  No cyanosis, clubbing, or edema.  Assessment:   1. Acute left-sided low back pain, with sciatica presence unspecified  - POCT urinalysis dipstick - Urine Culture  2. Vaginal discharge  - NuSwab Vaginitis Plus (VG+)   3. Vulvar itching   4. Pelvic pain   Plan:   Discussed home treatment measures and vaginal health techniques.   Labs: NuSwab and urine culture collected, see orders.   Rx: Solosec and Diflucan, see orders.   Reviewed red flag symptoms and when to call.   Follow up as needed.    Gunnar Bulla, CNM [facility], [facility]

## 2017-09-25 DIAGNOSIS — G4733 Obstructive sleep apnea (adult) (pediatric): Secondary | ICD-10-CM | POA: Diagnosis not present

## 2017-10-06 DIAGNOSIS — G4733 Obstructive sleep apnea (adult) (pediatric): Secondary | ICD-10-CM | POA: Diagnosis not present

## 2017-10-13 ENCOUNTER — Encounter: Payer: Self-pay | Admitting: Obstetrics and Gynecology

## 2017-10-26 DIAGNOSIS — G4733 Obstructive sleep apnea (adult) (pediatric): Secondary | ICD-10-CM | POA: Diagnosis not present

## 2017-11-24 ENCOUNTER — Encounter: Payer: Self-pay | Admitting: Obstetrics and Gynecology

## 2017-11-25 DIAGNOSIS — G4733 Obstructive sleep apnea (adult) (pediatric): Secondary | ICD-10-CM | POA: Diagnosis not present

## 2017-12-02 ENCOUNTER — Ambulatory Visit (INDEPENDENT_AMBULATORY_CARE_PROVIDER_SITE_OTHER): Payer: 59 | Admitting: Obstetrics and Gynecology

## 2017-12-02 ENCOUNTER — Encounter: Payer: Self-pay | Admitting: Obstetrics and Gynecology

## 2017-12-02 ENCOUNTER — Other Ambulatory Visit: Payer: Self-pay | Admitting: Obstetrics and Gynecology

## 2017-12-02 VITALS — BP 111/67 | HR 89 | Ht 65.0 in | Wt 305.1 lb

## 2017-12-02 DIAGNOSIS — R102 Pelvic and perineal pain: Secondary | ICD-10-CM

## 2017-12-02 LAB — POCT URINALYSIS DIPSTICK
Bilirubin, UA: NEGATIVE
Blood, UA: NEGATIVE
Glucose, UA: NEGATIVE
Ketones, UA: NEGATIVE
Leukocytes, UA: NEGATIVE
Nitrite, UA: NEGATIVE
Protein, UA: NEGATIVE
Spec Grav, UA: 1.01 (ref 1.010–1.025)
Urobilinogen, UA: 0.2 E.U./dL
pH, UA: 6.5 (ref 5.0–8.0)

## 2017-12-02 NOTE — Patient Instructions (Signed)
Pelvic Pain, Female Pelvic pain is pain in your lower abdomen, below your belly button and between your hips. The pain may start suddenly (acute), keep coming back (recurring), or last a long time (chronic). Pelvic pain that lasts longer than six months is considered chronic. Pelvic pain may affect your:  Reproductive organs.  Urinary system.  Digestive tract.  Musculoskeletal system.  There are many potential causes of pelvic pain. Sometimes, the pain can be a result of digestive or urinary conditions, strained muscles or ligaments, or even reproductive conditions. Sometimes the cause of pelvic pain is not known. Follow these instructions at home:  Take over-the-counter and prescription medicines only as told by your health care provider.  Rest as told by your health care provider.  Do not have sex it if hurts.  Keep a journal of your pelvic pain. Write down: ? When the pain started. ? Where the pain is located. ? What seems to make the pain better or worse, such as food or your menstrual cycle. ? Any symptoms you have along with the pain.  Keep all follow-up visits as told by your health care provider. This is important. Contact a health care provider if:  Medicine does not help your pain.  Your pain comes back.  You have new symptoms.  You have abnormal vaginal discharge or bleeding, including bleeding after menopause.  You have a fever or chills.  You are constipated.  You have blood in your urine or stool.  You have foul-smelling urine.  You feel weak or lightheaded. Get help right away if:  You have sudden severe pain.  Your pain gets steadily worse.  You have severe pain along with fever, nausea, vomiting, or excessive sweating.  You lose consciousness. This information is not intended to replace advice given to you by your health care provider. Make sure you discuss any questions you have with your health care provider. Document Released: 05/26/2004  Document Revised: 07/24/2015 Document Reviewed: 04/19/2015 Elsevier Interactive Patient Education  2018 Elsevier Inc.  

## 2017-12-02 NOTE — Progress Notes (Signed)
  Subjective:     Patient ID: Cathy Jimenez, female   DOB: 1981/06/28, 37 y.o.   MRN: [id number]  HPI Presents with c/o LLQ pain intermittent and sharp/burning with some radiation down thigh. No menses due to IUD. Pain has been worse today. Denies fever or other symptoms, including dysparenia or postcoital bleeding. No change in medications and feels like she is hydrating well.  Review of Systems  Constitutional: Positive for chills.  HENT: Negative.   Eyes: Negative.   Respiratory: Negative.   Cardiovascular: Negative.   Gastrointestinal: Negative.   Endocrine: Negative.   Genitourinary: Positive for pelvic pain.  Musculoskeletal: Negative.   Skin: Negative.   Allergic/Immunologic: Negative.   Neurological: Negative.   Hematological: Negative.   Psychiatric/Behavioral: Negative.        Objective:   Physical Exam A&Ox4 Blood pressure 111/67, pulse 89, height  (1.651 m), weight (!) 305 lb 1.6 oz (138.4 kg). Urinalysis    Component Value Date/Time   BILIRUBINUR neg 12/02/2017 1525   PROTEINUR Negative 12/02/2017 1525   UROBILINOGEN 0.2 12/02/2017 1525   NITRITE neg 12/02/2017 1525   LEUKOCYTESUR Negative 12/02/2017 1525  well groomed female in no distress Abdomen soft, obese and not tender Pelvic exam: normal external genitalia, vulva, vagina, cervix, uterus(off to left and retroflexed) and adnexa, IUD string palpated.    Assessment:     LLQ pelvic pain    Plan:     Discussed common reasons for pelvic pain, including UTI, IUD, ovarian cysts, bowel & bladder issues. Will return next week for pelvic u/s and to see me afterwards. Also is past due for AE and will schedule after next visit. Motrin  q6h prn recommended.  Phelan Goers,CNM

## 2017-12-08 ENCOUNTER — Encounter: Payer: Self-pay | Admitting: Obstetrics and Gynecology

## 2017-12-09 ENCOUNTER — Ambulatory Visit (INDEPENDENT_AMBULATORY_CARE_PROVIDER_SITE_OTHER): Payer: 59

## 2017-12-09 DIAGNOSIS — R102 Pelvic and perineal pain: Secondary | ICD-10-CM | POA: Diagnosis not present

## 2017-12-09 DIAGNOSIS — G4733 Obstructive sleep apnea (adult) (pediatric): Secondary | ICD-10-CM | POA: Diagnosis not present

## 2017-12-09 DIAGNOSIS — I10 Essential (primary) hypertension: Secondary | ICD-10-CM | POA: Diagnosis not present

## 2018-03-09 DIAGNOSIS — Z0000 Encounter for general adult medical examination without abnormal findings: Secondary | ICD-10-CM | POA: Diagnosis not present

## 2018-03-16 DIAGNOSIS — I10 Essential (primary) hypertension: Secondary | ICD-10-CM | POA: Diagnosis not present

## 2018-03-16 DIAGNOSIS — Z0000 Encounter for general adult medical examination without abnormal findings: Secondary | ICD-10-CM | POA: Diagnosis not present

## 2018-07-04 DIAGNOSIS — G4733 Obstructive sleep apnea (adult) (pediatric): Secondary | ICD-10-CM | POA: Diagnosis not present

## 2018-09-12 DIAGNOSIS — I10 Essential (primary) hypertension: Secondary | ICD-10-CM | POA: Diagnosis not present

## 2018-09-16 DIAGNOSIS — I10 Essential (primary) hypertension: Secondary | ICD-10-CM | POA: Diagnosis not present

## 2018-09-16 DIAGNOSIS — G4733 Obstructive sleep apnea (adult) (pediatric): Secondary | ICD-10-CM | POA: Diagnosis not present

## 2018-11-01 DIAGNOSIS — G4733 Obstructive sleep apnea (adult) (pediatric): Secondary | ICD-10-CM | POA: Diagnosis not present

## 2018-12-26 ENCOUNTER — Telehealth: Payer: Self-pay

## 2018-12-26 NOTE — Telephone Encounter (Signed)
Coronavirus (COVID-19) Are you at risk?  Are you at risk for the Coronavirus (COVID-19)?  To be considered HIGH RISK for Coronavirus (COVID-19), you have to meet the following criteria:  . Traveled to [place], [place], [place], [place] or [place]; or in the [place] to [place], [place], [place], or [place]; and have fever, cough, and shortness of breath within the last 2 weeks of travel OR . Been in close contact with a person diagnosed with COVID-19 within the last 2 weeks and have fever, cough, and shortness of breath . IF YOU DO NOT MEET THESE CRITERIA, YOU ARE CONSIDERED LOW RISK FOR COVID-19.  What to do if you are HIGH RISK for COVID-19?  . If you are having a medical emergency, call 911. . Seek medical care right away. Before you go to a doctor's office, urgent care or emergency department, call ahead and tell them about your recent travel, contact with someone diagnosed with COVID-19, and your symptoms. You should receive instructions from your physician's office regarding next steps of care.  . When you arrive at healthcare provider, tell the healthcare staff immediately you have returned from visiting [place], [place], [place], [place] or [place]; or traveled in the [place] to [place], [place], [place], or [place]; in the last two weeks or you have been in close contact with a person diagnosed with COVID-19 in the last 2 weeks.   . Tell the health care staff about your symptoms: fever, cough and shortness of breath. . After you have been seen by a medical provider, you will be either: o Tested for (COVID-19) and discharged home on quarantine except to seek medical care if symptoms worsen, and asked to  - Stay home and avoid contact with others until you get your results (4-5 days)  - Avoid travel on public transportation if possible (such as bus, train, or airplane) or o Sent to the Emergency Department by EMS for evaluation, COVID-19 testing, and possible  admission depending on your condition and test results.  What to do if you are LOW RISK for COVID-19?  Reduce your risk of any infection by using the same precautions used for avoiding the common cold or flu:  . Wash your hands often with soap and warm water for at least 20 seconds.  If soap and water are not readily available, use an alcohol-based hand sanitizer with at least 60% alcohol.  . If coughing or sneezing, cover your mouth and nose by coughing or sneezing into the elbow areas of your shirt or coat, into a tissue or into your sleeve (not your hands). . Avoid shaking hands with others and consider head nods or verbal greetings only. . Avoid touching your eyes, nose, or mouth with unwashed hands.  . Avoid close contact with people who are Cathy Jimenez. . Avoid places or events with large numbers of people in one location, like concerts or sporting events. . Carefully consider travel plans you have or are making. . If you are planning any travel outside or inside the [place], visit the CDC's Travelers' Health webpage for the latest health notices. . If you have some symptoms but not all symptoms, continue to monitor at home and seek medical attention if your symptoms worsen. . If you are having a medical emergency, call 911.  12/26/18 SCREENING NEG SLS ADDITIONAL HEALTHCARE OPTIONS FOR PATIENTS  [facility] / e-Visit: [email or web address]         [facility]: [phone]    [facility]: [phone]                   [facility]: [phone]  

## 2018-12-27 ENCOUNTER — Other Ambulatory Visit: Payer: Self-pay

## 2018-12-27 ENCOUNTER — Encounter: Payer: Self-pay | Admitting: Obstetrics and Gynecology

## 2018-12-27 ENCOUNTER — Ambulatory Visit (INDEPENDENT_AMBULATORY_CARE_PROVIDER_SITE_OTHER): Payer: 59 | Admitting: Obstetrics and Gynecology

## 2018-12-27 VITALS — BP 114/85 | HR 69 | Ht 65.0 in | Wt 335.6 lb

## 2018-12-27 DIAGNOSIS — Z30433 Encounter for removal and reinsertion of intrauterine contraceptive device: Secondary | ICD-10-CM

## 2018-12-27 NOTE — Progress Notes (Signed)
Cathy Jimenez is a 38 y.o. year old G10P1011 Caucasian female who presents for removal and replacement of a Mirena IUD. She was given informed consent for removal and reinsertion of her Mirena. Her Mirena was placed 2014, No LMP recorded. (Menstrual status: IUD)., and her pregnancy test today was negative.   The risks and benefits of the method and placement have been thouroughly reviewed with the patient and all questions were answered.  Specifically the patient is aware of failure rate of 07/998, expulsion of the IUD and of possible perforation.  The patient is aware of irregular bleeding due to the method and understands the incidence of irregular bleeding diminishes with time.  Signed copy of informed consent in chart.   No LMP recorded. (Menstrual status: IUD). BP 114/85   Pulse 69   Ht 5\' 5"  (1.651 m)   Wt (!) 335 lb 9.6 oz (152.2 kg)   BMI 55.85 kg/m  No results found for this or any previous visit (from the past 24 hour(s)).   Appropriate time out taken. A pederson speculum was placed in the vagina.  The cervix was visualized, prepped using Betadine. The strings were visible. They were grasped and the Mirena was easily removed. The cervix was then grasped with a single-tooth tenaculum. The uterus was found to be anteroflexed and it sounded to 8 cm.  Mirena IUD placed per manufacturer's recommendations without complications. The strings were trimmed to 3 cm.  The patient tolerated the procedure well.    The patient was given post procedure instructions, including signs and symptoms of infection and to check for the strings after each menses or each month, and refraining from intercourse or anything in the vagina for 3 days.  She was given a Mirena care card with date Mirena placed, and date Mirena to be removed.    Melody Rockney Ghee, CNM

## 2019-02-15 ENCOUNTER — Encounter: Payer: 59 | Admitting: Obstetrics and Gynecology

## 2019-03-01 ENCOUNTER — Encounter: Payer: Self-pay | Admitting: Obstetrics and Gynecology

## 2019-03-01 ENCOUNTER — Ambulatory Visit (INDEPENDENT_AMBULATORY_CARE_PROVIDER_SITE_OTHER): Payer: 59 | Admitting: Obstetrics and Gynecology

## 2019-03-01 ENCOUNTER — Other Ambulatory Visit (HOSPITAL_COMMUNITY)
Admission: RE | Admit: 2019-03-01 | Discharge: 2019-03-01 | Disposition: A | Discharge status: Home / Self Care | Payer: 59 | Source: Ambulatory Visit | Attending: Obstetrics and Gynecology | Admitting: Obstetrics and Gynecology

## 2019-03-01 ENCOUNTER — Other Ambulatory Visit: Payer: Self-pay

## 2019-03-01 VITALS — BP 128/84 | HR 86 | Ht 64.0 in | Wt 333.2 lb

## 2019-03-01 DIAGNOSIS — Z6841 Body Mass Index (BMI) 40.0 and over, adult: Secondary | ICD-10-CM | POA: Diagnosis not present

## 2019-03-01 DIAGNOSIS — Z01419 Encounter for gynecological examination (general) (routine) without abnormal findings: Secondary | ICD-10-CM

## 2019-03-01 NOTE — Progress Notes (Signed)
SUBJECTIVE:  38 y.o. caucasian female for annual routine Pap and checkup with no concerns.   Pt reports ne health concerns. Sexually active with single female partner. IUD in place, no concerns since placement 6wks ago. No bleeding noted with IUD replacement. No concern for STIs at this time. Discussed minor discomfort with vaginal penetration by female partner, provided uberlube samples.   Pt reports beginning changes to her diet, joined weight watchers, and has a fitbit to be able to track steps. Discussed making little changes to the diet at a time with small ways to increase exercise like parking your car further from the door or taking the stairs.   Discussed getting a baseline mammogram within the next month so that we have it as comparison moving forward.     Current Outpatient Medications  Medication Sig Dispense Refill  . citalopram (CELEXA) 40 MG tablet Take by mouth.    . fluticasone (FLONASE) 50 MCG/ACT nasal spray Place into the nose.    . hydrochlorothiazide (HYDRODIURIL) 25 MG tablet Take 25 mg by mouth daily.    Marland Kitchen levonorgestrel (MIRENA) 20 MCG/24HR IUD 1 each by Intrauterine route once.    . naproxen (NAPROSYN) 500 MG tablet Take 500 mg by mouth 2 (two) times daily with a meal. Reported on 09/16/2015    . fluconazole (DIFLUCAN) 150 MG tablet Take 1 tablet (150 mg total) by mouth every 3 (three) days. For three doses (Patient not taking: Reported on 12/02/2017) 3 tablet 0   No current facility-administered medications for this visit.    Allergies: Patient has no known allergies.  No LMP recorded. (Menstrual status: IUD).  ROS:  Feeling well. No dyspnea or chest pain on exertion.  No abdominal pain, change in bowel habits, black or bloody stools.  No urinary tract symptoms. GYN ROS: no breast pain or new or enlarging lumps on self exam, no vaginal bleeding. No neurological complaints.  OBJECTIVE:  The patient appears well, alert, oriented x 3, in no distress. BP 128/84   Pulse  86   Ht 5\' 4"  (1.626 m)   Wt (!) 151.1 kg   BMI 57.19 kg/m  ENT normal.  Neck supple. No adenopathy or thyromegaly. PERLA. Lungs are clear, good air entry, no wheezes, rhonchi or rales. S1 and S2 normal, no murmurs, regular rate and rhythm. Abdomen soft without tenderness, guarding, mass or organomegaly. Extremities show no edema, normal peripheral pulses. Neurological is normal, no focal findings.  BREAST EXAM: breasts appear normal, no suspicious masses, no skin or nipple changes or axillary nodes  PELVIC EXAM: normal external genitalia, vulva, vagina, cervix, uterus and adnexa. Speculum exam performed, pap with HPV reflex collected.   ASSESSMENT:  well woman BMI 57  PLAN:  return in 1 yr for AE or sooner if needed Labs ordered Mammogram ordered

## 2019-03-01 NOTE — Patient Instructions (Signed)
 Preventive Care 21-39 Years Old, Female Preventive care refers to visits with your health care provider and lifestyle choices that can promote health and wellness. This includes:  A yearly physical exam. This may also be called an annual well check.  Regular dental visits and eye exams.  Immunizations.  Screening for certain conditions.  Healthy lifestyle choices, such as eating a healthy diet, getting regular exercise, not using drugs or products that contain nicotine and tobacco, and limiting alcohol use. What can I expect for my preventive care visit? Physical exam Your health care provider will check your:  Height and weight. This may be used to calculate body mass index (BMI), which tells if you are at a healthy weight.  Heart rate and blood pressure.  Skin for abnormal spots. Counseling Your health care provider may ask you questions about your:  Alcohol, tobacco, and drug use.  Emotional well-being.  Home and relationship well-being.  Sexual activity.  Eating habits.  Work and work environment.  Method of birth control.  Menstrual cycle.  Pregnancy history. What immunizations do I need?  Influenza (flu) vaccine  This is recommended every year. Tetanus, diphtheria, and pertussis (Tdap) vaccine  You may need a Td booster every 10 years. Varicella (chickenpox) vaccine  You may need this if you have not been vaccinated. Human papillomavirus (HPV) vaccine  If recommended by your health care provider, you may need three doses over 6 months. Measles, mumps, and rubella (MMR) vaccine  You may need at least one dose of MMR. You may also need a second dose. Meningococcal conjugate (MenACWY) vaccine  One dose is recommended if you are age 19-21 years and a first-year college student living in a residence hall, or if you have one of several medical conditions. You may also need additional booster doses. Pneumococcal conjugate (PCV13) vaccine  You may need  this if you have certain conditions and were not previously vaccinated. Pneumococcal polysaccharide (PPSV23) vaccine  You may need one or two doses if you smoke cigarettes or if you have certain conditions. Hepatitis A vaccine  You may need this if you have certain conditions or if you travel or work in places where you may be exposed to hepatitis A. Hepatitis B vaccine  You may need this if you have certain conditions or if you travel or work in places where you may be exposed to hepatitis B. Haemophilus influenzae type b (Hib) vaccine  You may need this if you have certain conditions. You may receive vaccines as individual doses or as more than one vaccine together in one shot (combination vaccines). Talk with your health care provider about the risks and benefits of combination vaccines. What tests do I need?  Blood tests  Lipid and cholesterol levels. These may be checked every 5 years starting at age 20.  Hepatitis C test.  Hepatitis B test. Screening  Diabetes screening. This is done by checking your blood sugar (glucose) after you have not eaten for a while (fasting).  Sexually transmitted disease (STD) testing.  BRCA-related cancer screening. This may be done if you have a family history of breast, ovarian, tubal, or peritoneal cancers.  Pelvic exam and Pap test. This may be done every 3 years starting at age 21. Starting at age 30, this may be done every 5 years if you have a Pap test in combination with an HPV test. Talk with your health care provider about your test results, treatment options, and if necessary, the need for more   tests. Follow these instructions at home: Eating and drinking   Eat a diet that includes fresh fruits and vegetables, whole grains, lean protein, and low-fat dairy.  Take vitamin and mineral supplements as recommended by your health care provider.  Do not drink alcohol if: ? Your health care provider tells you not to drink. ? You are  pregnant, may be pregnant, or are planning to become pregnant.  If you drink alcohol: ? Limit how much you have to 0-1 drink a day. ? Be aware of how much alcohol is in your drink. In the [place] one drink equals one 12 oz bottle of beer (355 mL), one 5 oz glass of wine (148 mL), or one 1 oz glass of hard liquor (44 mL). Lifestyle  Take daily care of your teeth and gums.  Stay active. Exercise for at least 30 minutes on 5 or more days each week.  Do not use any products that contain nicotine or tobacco, such as cigarettes, e-cigarettes, and chewing tobacco. If you need help quitting, ask your health care provider.  If you are sexually active, practice safe sex. Use a condom or other form of birth control (contraception) in order to prevent pregnancy and STIs (sexually transmitted infections). If you plan to become pregnant, see your health care provider for a preconception visit. What's next?  Visit your health care provider once a year for a well check visit.  Ask your health care provider how often you should have your eyes and teeth checked.  Stay up to date on all vaccines. This information is not intended to replace advice given to you by your health care provider. Make sure you discuss any questions you have with your health care provider. Document Released: 08/25/2001 Document Revised: 03/10/2018 Document Reviewed: 03/10/2018 Elsevier Patient Education  2020 [employer].

## 2019-03-02 LAB — THYROID PANEL WITH TSH
Free Thyroxine Index: 1.6 (ref 1.2–4.9)
T3 Uptake Ratio: 25 % (ref 24–39)
T4, Total: 6.3 ug/dL (ref 4.5–12.0)
TSH: 3.54 u[IU]/mL (ref 0.450–4.500)

## 2019-03-02 LAB — COMPREHENSIVE METABOLIC PANEL
ALT: 21 IU/L (ref 0–32)
AST: 18 IU/L (ref 0–40)
Albumin/Globulin Ratio: 1.6 (ref 1.2–2.2)
Albumin: 4.1 g/dL (ref 3.8–4.8)
Alkaline Phosphatase: 71 IU/L (ref 39–117)
BUN/Creatinine Ratio: 14 (ref 9–23)
BUN: 10 mg/dL (ref 6–20)
Bilirubin Total: 0.3 mg/dL (ref 0.0–1.2)
CO2: 24 mmol/L (ref 20–29)
Calcium: 9.3 mg/dL (ref 8.7–10.2)
Chloride: 101 mmol/L (ref 96–106)
Creatinine, Ser: 0.74 mg/dL (ref 0.57–1.00)
GFR calc Af Amer: 119 mL/min/{1.73_m2} (ref >59)
GFR calc non Af Amer: 103 mL/min/{1.73_m2} (ref >59)
Globulin, Total: 2.6 g/dL (ref 1.5–4.5)
Glucose: 75 mg/dL (ref 65–99)
Potassium: 4 mmol/L (ref 3.5–5.2)
Sodium: 138 mmol/L (ref 134–144)
Total Protein: 6.7 g/dL (ref 6.0–8.5)

## 2019-03-02 LAB — HEMOGLOBIN A1C
Est. average glucose Bld gHb Est-mCnc: 108 mg/dL
Hgb A1c MFr Bld: 5.4 % (ref 4.8–5.6)

## 2019-03-02 LAB — LIPID PANEL
Chol/HDL Ratio: 3.9 ratio (ref 0.0–4.4)
Cholesterol, Total: 177 mg/dL (ref 100–199)
HDL: 45 mg/dL (ref >39)
LDL Calculated: 115 mg/dL — ABNORMAL HIGH (ref 0–99)
Triglycerides: 86 mg/dL (ref 0–149)
VLDL Cholesterol Cal: 17 mg/dL (ref 5–40)

## 2019-03-04 LAB — CYTOLOGY - PAP
Diagnosis: NEGATIVE
HPV: NOT DETECTED

## 2020-05-22 ENCOUNTER — Other Ambulatory Visit (HOSPITAL_COMMUNITY)
Admission: RE | Admit: 2020-05-22 | Discharge: 2020-05-22 | Disposition: A | Discharge status: Home / Self Care | Payer: 59 | Source: Ambulatory Visit | Attending: Certified Nurse Midwife | Admitting: Certified Nurse Midwife

## 2020-05-22 ENCOUNTER — Ambulatory Visit: Payer: 59 | Admitting: Certified Nurse Midwife

## 2020-05-22 ENCOUNTER — Encounter: Payer: Self-pay | Admitting: Certified Nurse Midwife

## 2020-05-22 ENCOUNTER — Other Ambulatory Visit: Payer: Self-pay

## 2020-05-22 VITALS — BP 131/86 | HR 71 | Ht 65.0 in | Wt 339.5 lb

## 2020-05-22 DIAGNOSIS — R350 Frequency of micturition: Secondary | ICD-10-CM | POA: Diagnosis not present

## 2020-05-22 DIAGNOSIS — N898 Other specified noninflammatory disorders of vagina: Secondary | ICD-10-CM | POA: Insufficient documentation

## 2020-05-22 DIAGNOSIS — Z30431 Encounter for routine checking of intrauterine contraceptive device: Secondary | ICD-10-CM | POA: Diagnosis not present

## 2020-05-22 LAB — POCT URINALYSIS DIPSTICK
Bilirubin, UA: NEGATIVE
Blood, UA: NEGATIVE
Glucose, UA: NEGATIVE
Ketones, UA: NEGATIVE
Leukocytes, UA: NEGATIVE
Nitrite, UA: NEGATIVE
Protein, UA: NEGATIVE
Spec Grav, UA: 1.01 (ref 1.010–1.025)
Urobilinogen, UA: 0.2 E.U./dL
pH, UA: 5 (ref 5.0–8.0)

## 2020-05-22 NOTE — Addendum Note (Signed)
Addended by: Brooke Dare on: 05/22/2020 04:07 PM   Modules accepted: Orders

## 2020-05-22 NOTE — Progress Notes (Signed)
GYN ENCOUNTER NOTE  Subjective:       Cathy Jimenez is a 39 y.o. G55P1011 female is here for gynecologic evaluation of the following issues:  1. increased bothersome discharge for the past 2-3 wks 2. Increased abdominal cramping and lower back pain 3. Urinary frequency   Pt has Mirena IUD in placed.  Obstetric History OB History  Gravida Para Term Preterm AB Living  2 1 1   1 1   SAB TAB Ectopic Multiple Live Births  1       1    # Outcome Date GA Lbr Len/2nd Weight Sex Delivery Anes PTL Lv  2 SAB 2011          1 Term 2010    F Vag-Spont   LIV    Past Medical History:  Diagnosis Date   Anxiety    Hypertension    Obesity     Past Surgical History:  Procedure Laterality Date   CESAREAN SECTION  2010   PIH    Current Outpatient Medications on File Prior to Visit  Medication Sig Dispense Refill   citalopram (CELEXA) 40 MG tablet Take by mouth.     fluticasone (FLONASE) 50 MCG/ACT nasal spray Place into the nose.     hydrochlorothiazide (HYDRODIURIL) 25 MG tablet Take 25 mg by mouth daily.     levonorgestrel (MIRENA) 20 MCG/24HR IUD 1 each by Intrauterine route once.     naproxen (NAPROSYN) 500 MG tablet Take 500 mg by mouth 2 (two) times daily with a meal. Reported on 09/16/2015     cetirizine (ZYRTEC) 10 MG tablet Take by mouth.     losartan (COZAAR) 25 MG tablet Take 25 mg by mouth daily.     Multiple Vitamin (MULTI-VITAMIN) tablet Take 1 tablet by mouth daily.     No current facility-administered medications on file prior to visit.    No Known Allergies  Social History   Socioeconomic History   Marital status: Married    Spouse name: Not on file   Number of children: Not on file   Years of education: Not on file   Highest education level: Not on file  Occupational History   Not on file  Tobacco Use   Smoking status: Former Smoker   Smokeless tobacco: Never Used  11/16/2015 Use: Never used  Substance and Sexual Activity    Alcohol use: Yes    Comment: OCCAS   Drug use: No   Sexual activity: Yes    Birth control/protection: I.U.D.    Comment: MIRENA  Other Topics Concern   Not on file  Social History Narrative   Not on file   Social Determinants of Health   Financial Resource Strain:    Difficulty of Paying Living Expenses: Not on file  Food Insecurity:    Worried About Running Out of Food in the Last Year: Not on file   Ran Out of Food in the Last Year: Not on file  Transportation Needs:    Lack of Transportation (Medical): Not on file   Lack of Transportation (Non-Medical): Not on file  Physical Activity:    Days of Exercise per Week: Not on file   Minutes of Exercise per Session: Not on file  Stress:    Feeling of Stress : Not on file  Social Connections:    Frequency of Communication with Friends and Family: Not on file   Frequency of Social Gatherings with Friends and Family: Not on file  Attends Religious Services: Not on file   Active Member of Clubs or Organizations: Not on file   Attends Banker Meetings: Not on file   Marital Status: Not on file  Intimate Partner Violence:    Fear of Current or Ex-Partner: Not on file   Emotionally Abused: Not on file   Physically Abused: Not on file   Sexually Abused: Not on file    Family History  Problem Relation Age of Onset   Heart disease Father    Hypertension Father    Diabetes Maternal Grandmother    Heart disease Maternal Grandmother    Cancer Maternal Grandfather        LIVER    The following portions of the patient's history were reviewed and updated as appropriate: allergies, current medications, past family history, past medical history, past social history, past surgical history and problem list.  Review of Systems Review of Systems - Negative except as mentioned in HPI Review of Systems - General ROS: negative for - chills, fatigue, fever, hot flashes, malaise or night  sweats Hematological and Lymphatic ROS: negative for - bleeding problems or swollen lymph nodes Gastrointestinal ROS: negative for - abdominal pain, blood in stools, change in bowel habits and nausea/vomiting Musculoskeletal ROS: negative for - joint pain, muscle pain or muscular weakness Genito-Urinary ROS: negative for - change in menstrual cycle, dysmenorrhea, dyspareunia, dysuria, genital discharge, genital ulcers, hematuria, incontinence, irregular/heavy menses, nocturia. Positive in slight increase in vaginal discharge with history of BV, urinary frequency and pelvic cramping.   Objective:   BP 131/86    Pulse 71    Ht 5\' 5"  (1.651 m)    Wt (!) 339 lb 8 oz (154 kg)    BMI 56.50 kg/m  CONSTITUTIONAL: Well-developed, well-nourished female in no acute distress.  HENT:  Normocephalic, atraumatic.  NECK: Normal range of motion, supple, no masses.  Normal thyroid.  SKIN: Skin is warm and dry. No rash noted. Not diaphoretic. No erythema. No pallor. NEUROLGIC: Alert and oriented to person, place, and time. PSYCHIATRIC: Normal mood and affect. Normal behavior. Normal judgment and thought content. CARDIOVASCULAR:Not Examined RESPIRATORY: Not Examined BREASTS: Not Examined ABDOMEN: Soft, non distended; Non tender.  No Organomegaly. PELVIC:  External Genitalia: Normal  BUS: Normal  Vagina: Normal  Cervix: Normal, no strings seen  MUSCULOSKELETAL: Normal range of motion. No tenderness.  No cyanosis, clubbing, or edema.     Assessment:   1. IUD check up - PELVIC COMPLETE WITH TRANSVAGINAL; Future  2. Vaginal discharge - Cervicovaginal ancillary only  3. Urinary frequency - Urine Culture     Plan:  Vaginal swab collected for BV and yeast, urine culture for urinary frequency. Ultrasound ordered for IUD check, no strings seen on exam. Will follow up with result. Return PRN for u/s /   [place], CNM

## 2020-05-24 LAB — CERVICOVAGINAL ANCILLARY ONLY
Bacterial Vaginitis (gardnerella): POSITIVE — AB
Candida Glabrata: NEGATIVE
Candida Vaginitis: NEGATIVE
Comment: NEGATIVE
Comment: NEGATIVE
Comment: NEGATIVE

## 2020-05-24 LAB — URINE CULTURE

## 2020-05-25 ENCOUNTER — Other Ambulatory Visit: Payer: Self-pay | Admitting: Certified Nurse Midwife

## 2020-05-25 MED ORDER — METRONIDAZOLE 500 MG PO TABS
500.0000 mg | ORAL_TABLET | Freq: Two times a day (BID) | ORAL | 0 refills | Status: AC
Start: 2020-05-25 — End: 2020-06-01

## 2020-05-25 NOTE — Progress Notes (Signed)
Vaginal swab positive for BV, orders placed for treatment.   Cathy Jimenez, CNM  

## 2020-05-30 ENCOUNTER — Ambulatory Visit (INDEPENDENT_AMBULATORY_CARE_PROVIDER_SITE_OTHER): Payer: 59

## 2020-05-30 ENCOUNTER — Other Ambulatory Visit: Payer: Self-pay

## 2020-05-30 DIAGNOSIS — Z30431 Encounter for routine checking of intrauterine contraceptive device: Secondary | ICD-10-CM | POA: Diagnosis not present

## 2022-08-21 ENCOUNTER — Other Ambulatory Visit: Payer: Self-pay

## 2022-08-21 DIAGNOSIS — Z1231 Encounter for screening mammogram for malignant neoplasm of breast: Secondary | ICD-10-CM

## 2022-09-02 ENCOUNTER — Ambulatory Visit
Admission: RE | Admit: 2022-09-02 | Discharge: 2022-09-02 | Disposition: A | Discharge status: Home / Self Care | Payer: BC Managed Care – PPO | Source: Ambulatory Visit | Attending: Certified Nurse Midwife | Admitting: Certified Nurse Midwife

## 2022-09-02 DIAGNOSIS — Z1231 Encounter for screening mammogram for malignant neoplasm of breast: Secondary | ICD-10-CM | POA: Diagnosis not present

## 2023-11-08 ENCOUNTER — Other Ambulatory Visit: Payer: Self-pay | Admitting: Certified Nurse Midwife

## 2023-11-08 DIAGNOSIS — Z1231 Encounter for screening mammogram for malignant neoplasm of breast: Secondary | ICD-10-CM
